# Patient Record
Sex: Female | Born: 1980 | Race: Black or African American | Hispanic: No | Marital: Single | Smoking: Former smoker
Health system: Southern US, Community
[De-identification: ages and names within clinical notes are randomized; demographics above are authoritative.]

## PROBLEM LIST (undated history)

## (undated) DIAGNOSIS — E66813 Obesity, class 3: Secondary | ICD-10-CM

## (undated) DIAGNOSIS — D509 Iron deficiency anemia, unspecified: Secondary | ICD-10-CM

## (undated) DIAGNOSIS — J45909 Unspecified asthma, uncomplicated: Secondary | ICD-10-CM

## (undated) DIAGNOSIS — R7303 Prediabetes: Secondary | ICD-10-CM

## (undated) DIAGNOSIS — R030 Elevated blood-pressure reading, without diagnosis of hypertension: Secondary | ICD-10-CM

## (undated) DIAGNOSIS — IMO0001 Reserved for inherently not codable concepts without codable children: Secondary | ICD-10-CM

## (undated) DIAGNOSIS — E559 Vitamin D deficiency, unspecified: Secondary | ICD-10-CM

## (undated) DIAGNOSIS — J309 Allergic rhinitis, unspecified: Secondary | ICD-10-CM

## (undated) HISTORY — DX: Reserved for inherently not codable concepts without codable children: IMO0001

## (undated) HISTORY — DX: Elevated blood-pressure reading, without diagnosis of hypertension: R03.0

## (undated) HISTORY — DX: Iron deficiency anemia, unspecified: D50.9

## (undated) HISTORY — DX: Vitamin D deficiency, unspecified: E55.9

## (undated) HISTORY — DX: Unspecified asthma, uncomplicated: J45.909

## (undated) HISTORY — DX: Morbid (severe) obesity due to excess calories: E66.01

## (undated) HISTORY — DX: Allergic rhinitis, unspecified: J30.9

## (undated) HISTORY — DX: Obesity, class 3: E66.813

## (undated) HISTORY — DX: Prediabetes: R73.03

---

## 2012-12-21 ENCOUNTER — Emergency Department: Payer: Self-pay | Admitting: Emergency Medicine

## 2012-12-26 ENCOUNTER — Emergency Department: Payer: Self-pay | Admitting: Emergency Medicine

## 2013-10-12 ENCOUNTER — Emergency Department: Payer: Self-pay | Admitting: Emergency Medicine

## 2015-12-03 ENCOUNTER — Ambulatory Visit (INDEPENDENT_AMBULATORY_CARE_PROVIDER_SITE_OTHER): Payer: BC Managed Care – PPO | Admitting: Obstetrics and Gynecology

## 2015-12-03 ENCOUNTER — Other Ambulatory Visit: Payer: Self-pay | Admitting: Obstetrics and Gynecology

## 2015-12-03 VITALS — BP 121/76 | HR 100 | Ht 60.0 in | Wt 333.5 lb

## 2015-12-03 DIAGNOSIS — IMO0001 Reserved for inherently not codable concepts without codable children: Secondary | ICD-10-CM | POA: Insufficient documentation

## 2015-12-03 DIAGNOSIS — Z369 Encounter for antenatal screening, unspecified: Secondary | ICD-10-CM

## 2015-12-03 DIAGNOSIS — D509 Iron deficiency anemia, unspecified: Secondary | ICD-10-CM

## 2015-12-03 DIAGNOSIS — E66813 Obesity, class 3: Secondary | ICD-10-CM

## 2015-12-03 DIAGNOSIS — J45909 Unspecified asthma, uncomplicated: Secondary | ICD-10-CM

## 2015-12-03 DIAGNOSIS — R030 Elevated blood-pressure reading, without diagnosis of hypertension: Secondary | ICD-10-CM

## 2015-12-03 DIAGNOSIS — Z331 Pregnant state, incidental: Secondary | ICD-10-CM

## 2015-12-03 DIAGNOSIS — Z3687 Encounter for antenatal screening for uncertain dates: Secondary | ICD-10-CM

## 2015-12-03 DIAGNOSIS — E559 Vitamin D deficiency, unspecified: Secondary | ICD-10-CM

## 2015-12-03 DIAGNOSIS — Z3490 Encounter for supervision of normal pregnancy, unspecified, unspecified trimester: Secondary | ICD-10-CM

## 2015-12-03 DIAGNOSIS — Z1389 Encounter for screening for other disorder: Secondary | ICD-10-CM

## 2015-12-03 DIAGNOSIS — R7303 Prediabetes: Secondary | ICD-10-CM

## 2015-12-03 DIAGNOSIS — Z113 Encounter for screening for infections with a predominantly sexual mode of transmission: Secondary | ICD-10-CM

## 2015-12-03 DIAGNOSIS — Z36 Encounter for antenatal screening of mother: Secondary | ICD-10-CM

## 2015-12-03 MED ORDER — PROVIDA DHA 16-16-1.25-110 MG PO CAPS: 1.0000 | ORAL_CAPSULE | Freq: Every day | ORAL | Status: AC

## 2015-12-03 NOTE — Patient Instructions (Signed)

## 2015-12-03 NOTE — Progress Notes (Signed)
Savannah Ramos presents for NOB nurse interview visit. G-1.  P-0. Pregnancy confirmed at [facility] on 11/29/15. Pregnancy education material explained and given. No cats in the home. NOB labs ordered. TSH/HbgA1c due to Increased BMI, HIV labs and Drug screen were explained optional and she could opt out of tests but did not decline. Drug screen ordered. PNV encouraged. NT ordered, pt would like this done if her gestational age of baby is within window. Pt. To follow up with provider in 1 week for NOB physical, depending on [place] for viability and dating findings.  All questions answered.  ZIKA EXPOSURE SCREEN:  The patient has not traveled to a [place] Virus endemic area within the past 6 months, nor has she had unprotected sex with a partner who has travelled to a [place] endemic region within the past 6 months. The patient has been advised to notify us if these factors change any time during this current pregnancy, so adequate testing and monitoring can be initiated.

## 2015-12-05 LAB — PAIN MGT SCRN (14 DRUGS), UR
Amphetamine Screen, Ur: NEGATIVE ng/mL
BARBITURATE SCRN UR: NEGATIVE ng/mL
BUPRENORPHINE, URINE: NEGATIVE ng/mL
Benzodiazepine Screen, Urine: NEGATIVE ng/mL
COCAINE(METAB.) SCREEN, URINE: NEGATIVE ng/mL
CREATININE(CRT), U: 150 mg/dL (ref 20.0–300.0)
Cannabinoids Ur Ql Scn: NEGATIVE ng/mL
Fentanyl, Urine: NEGATIVE pg/mL
METHADONE SCREEN, URINE: NEGATIVE ng/mL
Meperidine Screen, Urine: NEGATIVE ng/mL
Opiate Scrn, Ur: NEGATIVE ng/mL
Oxycodone+Oxymorphone Ur Ql Scn: NEGATIVE ng/mL
PCP Scrn, Ur: NEGATIVE ng/mL
PH UR, DRUG SCRN: 5.7 (ref 4.5–8.9)
PROPOXYPHENE SCREEN: NEGATIVE ng/mL
Tramadol Ur Ql Scn: NEGATIVE ng/mL

## 2015-12-05 LAB — URINALYSIS, ROUTINE W REFLEX MICROSCOPIC
Bilirubin, UA: NEGATIVE
Glucose, UA: NEGATIVE
Ketones, UA: NEGATIVE
LEUKOCYTES UA: NEGATIVE
Nitrite, UA: NEGATIVE
PH UA: 6 (ref 5.0–7.5)
RBC, UA: NEGATIVE
Specific Gravity, UA: 1.02 (ref 1.005–1.030)
Urobilinogen, Ur: 0.2 mg/dL (ref 0.2–1.0)

## 2015-12-05 LAB — GC/CHLAMYDIA PROBE AMP
Chlamydia trachomatis, NAA: NEGATIVE
NEISSERIA GONORRHOEAE BY PCR: NEGATIVE

## 2015-12-05 LAB — NICOTINE SCREEN, URINE: COTININE UR QL SCN: NEGATIVE ng/mL

## 2015-12-05 LAB — CULTURE, OB URINE

## 2015-12-05 LAB — URINE CULTURE, OB REFLEX: ORGANISM ID, BACTERIA: NO GROWTH

## 2015-12-06 LAB — CBC WITH DIFFERENTIAL/PLATELET
Basophils Absolute: 0 10*3/uL (ref 0.0–0.2)
Basos: 0 %
EOS (ABSOLUTE): 0.9 10*3/uL — ABNORMAL HIGH (ref 0.0–0.4)
EOS: 6 %
HEMATOCRIT: 31 % — AB (ref 34.0–46.6)
HEMOGLOBIN: 9.5 g/dL — AB (ref 11.1–15.9)
IMMATURE GRANS (ABS): 0.1 10*3/uL (ref 0.0–0.1)
Immature Granulocytes: 1 %
LYMPHS ABS: 1.4 10*3/uL (ref 0.7–3.1)
Lymphs: 10 %
MCH: 20.7 pg — ABNORMAL LOW (ref 26.6–33.0)
MCHC: 30.6 g/dL — AB (ref 31.5–35.7)
MCV: 67 fL — ABNORMAL LOW (ref 79–97)
MONOCYTES: 4 %
Monocytes Absolute: 0.5 10*3/uL (ref 0.1–0.9)
NEUTROS ABS: 10.5 10*3/uL — AB (ref 1.4–7.0)
Neutrophils: 79 %
Platelets: 429 10*3/uL — ABNORMAL HIGH (ref 150–379)
RBC: 4.6 x10E6/uL (ref 3.77–5.28)
RDW: 17.2 % — ABNORMAL HIGH (ref 12.3–15.4)
WBC: 13.3 10*3/uL — AB (ref 3.4–10.8)

## 2015-12-06 LAB — TSH: TSH: 1.02 u[IU]/mL (ref 0.450–4.500)

## 2015-12-06 LAB — HEMOGLOBIN A1C
ESTIMATED AVERAGE GLUCOSE: 120 mg/dL
Hgb A1c MFr Bld: 5.8 % — ABNORMAL HIGH (ref 4.8–5.6)

## 2015-12-06 LAB — RH TYPE: RH TYPE: POSITIVE

## 2015-12-06 LAB — HIV ANTIBODY (ROUTINE TESTING W REFLEX): HIV Screen 4th Generation wRfx: NONREACTIVE

## 2015-12-06 LAB — SICKLE CELL SCREEN: SICKLE CELL SCREEN: NEGATIVE

## 2015-12-06 LAB — RUBELLA ANTIBODY, IGM

## 2015-12-06 LAB — ABO

## 2015-12-06 LAB — VARICELLA ZOSTER ANTIBODY, IGM: Varicella IgM: <0.91 index (ref 0.00–0.90)

## 2015-12-06 LAB — HEPATITIS B SURFACE ANTIGEN: Hepatitis B Surface Ag: NEGATIVE

## 2015-12-06 LAB — ANTIBODY SCREEN: Antibody Screen: NEGATIVE

## 2015-12-06 LAB — RPR: RPR: NONREACTIVE

## 2015-12-07 ENCOUNTER — Other Ambulatory Visit: Payer: Self-pay | Admitting: Obstetrics and Gynecology

## 2015-12-07 DIAGNOSIS — Z2839 Other underimmunization status: Secondary | ICD-10-CM | POA: Insufficient documentation

## 2015-12-07 DIAGNOSIS — Z283 Underimmunization status: Secondary | ICD-10-CM | POA: Insufficient documentation

## 2015-12-07 DIAGNOSIS — O9989 Other specified diseases and conditions complicating pregnancy, childbirth and the puerperium: Principal | ICD-10-CM

## 2015-12-07 DIAGNOSIS — O09899 Supervision of other high risk pregnancies, unspecified trimester: Secondary | ICD-10-CM | POA: Insufficient documentation

## 2015-12-07 MED ORDER — FUSION PLUS PO CAPS: 1.0000 | ORAL_CAPSULE | Freq: Every day | ORAL | Status: AC

## 2015-12-09 ENCOUNTER — Ambulatory Visit (INDEPENDENT_AMBULATORY_CARE_PROVIDER_SITE_OTHER): Payer: BC Managed Care – PPO

## 2015-12-09 ENCOUNTER — Other Ambulatory Visit: Payer: Self-pay | Admitting: Obstetrics and Gynecology

## 2015-12-09 DIAGNOSIS — Z369 Encounter for antenatal screening, unspecified: Secondary | ICD-10-CM

## 2015-12-09 DIAGNOSIS — Z36 Encounter for antenatal screening of mother: Secondary | ICD-10-CM | POA: Diagnosis not present

## 2015-12-15 ENCOUNTER — Ambulatory Visit (INDEPENDENT_AMBULATORY_CARE_PROVIDER_SITE_OTHER): Payer: BC Managed Care – PPO | Admitting: Obstetrics and Gynecology

## 2015-12-15 ENCOUNTER — Encounter: Payer: BC Managed Care – PPO | Admitting: Obstetrics and Gynecology

## 2015-12-15 ENCOUNTER — Encounter: Payer: Self-pay | Admitting: Obstetrics and Gynecology

## 2015-12-15 VITALS — BP 121/73 | HR 96 | Wt 330.2 lb

## 2015-12-15 DIAGNOSIS — Z331 Pregnant state, incidental: Secondary | ICD-10-CM

## 2015-12-15 LAB — POCT URINALYSIS DIPSTICK
BILIRUBIN UA: NEGATIVE
GLUCOSE UA: NEGATIVE
KETONES UA: NEGATIVE
Leukocytes, UA: NEGATIVE
Nitrite, UA: NEGATIVE
RBC UA: NEGATIVE
SPEC GRAV UA: 1.025
Urobilinogen, UA: 0.2
pH, UA: 6

## 2015-12-15 NOTE — Progress Notes (Signed)
NEW OB HISTORY AND PHYSICAL  SUBJECTIVE:       Savannah Ramos is a 35 y.o. G1P0 female, Patient's last menstrual period was 08/06/2015., Estimated Date of Delivery: 05/12/16, [redacted]w[redacted]d, presents today for establishment of Prenatal Care. She has no unusual complaints and complains of nothing      Gynecologic History Patient's last menstrual period was 08/06/2015. Unknown Contraception: none Last Pap: Aug 2016. Results were: normal  Obstetric History OB History  Gravida Para Term Preterm AB SAB TAB Ectopic Multiple Living  1             # Outcome Date GA Lbr Len/2nd Weight Sex Delivery Anes PTL Lv  1 Current               Past Medical History  Diagnosis Date  . Asthma     spring/summer  . Pre-diabetes   . Elevated blood pressure     no diagnosis of HTN  . Vitamin D deficiency   . Allergic rhinitis   . Obesity, Class III, BMI 40-49.9 (morbid obesity) (HCC)     BMI 66 (12/03/2015)  . Anemia, iron deficiency     History reviewed. No pertinent past surgical history.  Current Outpatient Prescriptions on File Prior to Visit  Medication Sig Dispense Refill  . albuterol (PROVENTIL) (2.5 MG/3ML) 0.083% nebulizer solution Take 2.5 mg by nebulization every 6 (six) hours as needed for wheezing or shortness of breath.    . cholecalciferol (VITAMIN D) 400 units TABS tablet Take 400 Units by mouth daily.    Marland Kitchen docusate sodium (COLACE) 100 MG capsule Take 100 mg by mouth 2 (two) times daily.    . Iron-FA-B Cmp-C-Biot-Probiotic (FUSION PLUS) CAPS Take 1 capsule by mouth daily. 60 capsule 1  . Prenat-FeFum-FePo-FA-DHA w/o A (PROVIDA DHA) 16-16-1.25-110 MG CAPS Take 1 tablet by mouth daily. 30 capsule 11   No current facility-administered medications on file prior to visit.    No Known Allergies  Social History   Social History  . Marital Status: Single    Spouse Name: N/A  . Number of Children: N/A  . Years of Education: N/A   Occupational History  .  [employer]    Social History Main Topics  . Smoking status: Former Games developer  . Smokeless tobacco: Never Used  . Alcohol Use: No  . Drug Use: No  . Sexual Activity:    Partners: Male   Other Topics Concern  . Not on file   Social History Narrative    Family History  Problem Relation Age of Onset  . Diabetes Mother   . Diabetes Father   . Heart failure Maternal Grandmother   . Hyperlipidemia Father   . Hypertension Father   . Hypertension Mother     The following portions of the patient's history were reviewed and updated as appropriate: allergies, current medications, past OB history, past medical history, past surgical history, past family history, past social history, and problem list.    OBJECTIVE: Initial Physical Exam (New OB)  GENERAL APPEARANCE: alert, well appearing, in no apparent distress, oriented to person, place and time HEAD: normocephalic, atraumatic MOUTH: mucous membranes moist, pharynx normal without lesions and dental hygiene poor THYROID: no thyromegaly or masses present BREASTS: not examined LUNGS: clear to auscultation, no wheezes, rales or rhonchi, symmetric air entry HEART: regular rate and rhythm, no murmurs ABDOMEN: soft, nontender, nondistended, no abnormal masses, no epigastric pain, obese, fundus not palpable and FHT present EXTREMITIES: no redness or tenderness  in the calves or thighs SKIN: normal coloration and turgor, no rashes LYMPH NODES: no adenopathy palpable NEUROLOGIC: alert, oriented, normal speech, no focal findings or movement disorder noted  PELVIC EXAM not examined  ASSESSMENT: Normal pregnancy Morbid obesity Late entry to care  PLAN: Prenatal care Early glucola Consider transfering based on BMI See orders

## 2015-12-15 NOTE — Progress Notes (Signed)
NOB-pt is doing well, has some concerns about pelvic pressure, a pulling sensation, otherwise she is doing ok

## 2015-12-16 ENCOUNTER — Telehealth: Payer: Self-pay | Admitting: *Deleted

## 2015-12-16 NOTE — Telephone Encounter (Signed)
Notified pt she voiced understanding 

## 2015-12-16 NOTE — Telephone Encounter (Signed)
-----   Message from Purcell Nails, [place] sent at 12/07/2015  5:38 PM EST ----- Please let her know she is still very anemic, I want her to stop current OTC iron and start Fusion Plus ( I sent in a prescription) Please give samples if she needs them

## 2015-12-31 ENCOUNTER — Other Ambulatory Visit: Payer: Self-pay | Admitting: Obstetrics and Gynecology

## 2015-12-31 ENCOUNTER — Ambulatory Visit (INDEPENDENT_AMBULATORY_CARE_PROVIDER_SITE_OTHER): Payer: BC Managed Care – PPO | Admitting: Obstetrics and Gynecology

## 2015-12-31 ENCOUNTER — Other Ambulatory Visit: Payer: BC Managed Care – PPO

## 2015-12-31 ENCOUNTER — Ambulatory Visit (INDEPENDENT_AMBULATORY_CARE_PROVIDER_SITE_OTHER): Payer: BC Managed Care – PPO

## 2015-12-31 ENCOUNTER — Encounter: Payer: Self-pay | Admitting: Obstetrics and Gynecology

## 2015-12-31 VITALS — BP 112/72 | HR 93 | Wt 331.8 lb

## 2015-12-31 DIAGNOSIS — Z36 Encounter for antenatal screening of mother: Secondary | ICD-10-CM | POA: Diagnosis not present

## 2015-12-31 DIAGNOSIS — Z331 Pregnant state, incidental: Secondary | ICD-10-CM

## 2015-12-31 LAB — POCT URINALYSIS DIPSTICK
Bilirubin, UA: NEGATIVE
Glucose, UA: NEGATIVE
Ketones, UA: NEGATIVE
NITRITE UA: NEGATIVE
PROTEIN UA: NEGATIVE
RBC UA: NEGATIVE
SPEC GRAV UA: 1.01
UROBILINOGEN UA: 0.2
pH, UA: 6

## 2015-12-31 NOTE — Progress Notes (Signed)
Indications: Anatomy Scan Findings:  Singleton intrauterine pregnancy is visualized with FHR at 165 BPM. Biometrics give an (U/S) Gestational age of [redacted] weeks 3 days, and an (U/S) EDD of 05/09/16; this correlates with the clinically established EDD of 05/12/16.  Fetal presentation is vertex, spine anterior.  EFW: 416 grams ( 0 lbs. 15 oz.). Placenta: Anterior, grade 0, remote to cervix by 4.7 cm. AFI: appears adequate  Anatomic survey is incomplete due to maternal body habitus and fetal position: Need: posterior fossa, cerebellum, cist magna, orbits, nose/lips, 3 vc, abdominal cord insert, diaphragm, kidneys, spine in transverse only, all heart views, and lower extremities.  Gender - Female.   Right and left ovaries were not visualized. There is no evidence of a corpus luteal cyst seen on today's exam. Survey of the adnexa demonstrates no adnexal masses. There is no free peritoneal fluid in the cul de sac.  Impression: 1. 21 week 3 day Viable Singleton Intrauterine pregnancy by U/S. 2. (U/S) EDD is consistent with Clinically established (LMP) EDD of 05/12/16. 3. Incomplete anatomy scan due to maternal body habitus and fetal positioning. See above for anatomy not seen.  Discussed need for transfer to high risks facility based on weight and BMI- will call back and let us know where she wants to go. To do follow-up scan there.

## 2015-12-31 NOTE — Progress Notes (Signed)
ROB- anatomy scan done today, pt denies any new complaints

## 2016-01-01 LAB — GLUCOSE, 1 HOUR GESTATIONAL: GESTATIONAL DIABETES SCREEN: 84 mg/dL (ref 65–139)

## 2016-01-04 ENCOUNTER — Telehealth: Payer: Self-pay | Admitting: *Deleted

## 2016-01-04 NOTE — Telephone Encounter (Signed)
Notified pt of lab results 

## 2016-01-04 NOTE — Telephone Encounter (Signed)
-----   Message from Purcell Nails, [place] sent at 01/03/2016  9:31 AM EST ----- Please let her know she passed her glucola

## 2016-01-06 ENCOUNTER — Other Ambulatory Visit: Payer: BC Managed Care – PPO

## 2016-10-07 ENCOUNTER — Encounter (HOSPITAL_COMMUNITY): Payer: Self-pay

## 2017-10-04 ENCOUNTER — Emergency Department: Payer: BC Managed Care – PPO

## 2017-10-04 ENCOUNTER — Emergency Department
Admission: EM | Admit: 2017-10-04 | Discharge: 2017-10-04 | Disposition: A | Discharge status: Home / Self Care | Payer: BC Managed Care – PPO | Attending: Student in an Organized Health Care Education/Training Program | Admitting: Student in an Organized Health Care Education/Training Program

## 2017-10-04 DIAGNOSIS — Z87891 Personal history of nicotine dependence: Secondary | ICD-10-CM | POA: Diagnosis not present

## 2017-10-04 DIAGNOSIS — J40 Bronchitis, not specified as acute or chronic: Secondary | ICD-10-CM | POA: Insufficient documentation

## 2017-10-04 DIAGNOSIS — Z79899 Other long term (current) drug therapy: Secondary | ICD-10-CM | POA: Insufficient documentation

## 2017-10-04 DIAGNOSIS — R079 Chest pain, unspecified: Secondary | ICD-10-CM | POA: Diagnosis present

## 2017-10-04 LAB — CBC
HEMATOCRIT: 35.7 % (ref 35.0–47.0)
HEMOGLOBIN: 11 g/dL — AB (ref 12.0–16.0)
MCH: 22.3 pg — AB (ref 26.0–34.0)
MCHC: 30.8 g/dL — AB (ref 32.0–36.0)
MCV: 72.5 fL — AB (ref 80.0–100.0)
Platelets: 410 10*3/uL (ref 150–440)
RBC: 4.93 MIL/uL (ref 3.80–5.20)
RDW: 16.3 % — AB (ref 11.5–14.5)
WBC: 12.4 10*3/uL — ABNORMAL HIGH (ref 3.6–11.0)

## 2017-10-04 LAB — BASIC METABOLIC PANEL
Anion gap: 12 (ref 5–15)
BUN: 10 mg/dL (ref 6–20)
CHLORIDE: 102 mmol/L (ref 101–111)
CO2: 26 mmol/L (ref 22–32)
Calcium: 8.6 mg/dL — ABNORMAL LOW (ref 8.9–10.3)
Creatinine, Ser: 0.71 mg/dL (ref 0.44–1.00)
GFR calc Af Amer: >60 mL/min (ref >60)
GLUCOSE: 103 mg/dL — AB (ref 65–99)
POTASSIUM: 4.1 mmol/L (ref 3.5–5.1)
Sodium: 140 mmol/L (ref 135–145)

## 2017-10-04 LAB — TROPONIN I: Troponin I: <0.03 ng/mL (ref <0.03)

## 2017-10-04 MED ORDER — ALBUTEROL SULFATE HFA 108 (90 BASE) MCG/ACT IN AERS: 2.0000 | INHALATION_SPRAY | Freq: Four times a day (QID) | RESPIRATORY_TRACT | 2 refills | Status: AC | PRN

## 2017-10-04 MED ORDER — PREDNISONE 20 MG PO TABS: 40.0000 mg | ORAL_TABLET | Freq: Every day | ORAL | 0 refills | Status: AC

## 2017-10-04 NOTE — ED Provider Notes (Addendum)
[facility] Emergency Department Provider Note    First MD Initiated Contact with Patient 10/04/17 2222     (approximate)  I have reviewed the triage vital signs and the nursing notes.   HISTORY  Chief Complaint Chest Pain    HPI Savannah Ramos is a 36 y.o. female with a history of asthma presents with chief complaint of midsternal chest pain radiating through to her back associated with coughing.  Describes it as a nagging ache and mild in severity.  Denies any nausea or diaphoresis.  No productive cough.  States that she has been more stressed at work.  Has not been using her inhaler at home.  Has not been on any steroids recently.  No personal history of heart attack.  Denies any orthopnea or lower extremity swelling.  Past Medical History:  Diagnosis Date  . Allergic rhinitis   . Anemia, iron deficiency   . Asthma    spring/summer  . Elevated blood pressure    no diagnosis of HTN  . Obesity, Class III, BMI 40-49.9 (morbid obesity) (HCC)    BMI 66 (12/03/2015)  . Pre-diabetes   . Vitamin D deficiency    Family History  Problem Relation Age of Onset  . Diabetes Mother   . Hypertension Mother   . Diabetes Father   . Hyperlipidemia Father   . Hypertension Father   . Heart failure Maternal Grandmother    Past Surgical History:  Procedure Laterality Date  . CESAREAN SECTION     Patient Active Problem List   Diagnosis Date Noted  . Rubella non-immune status, antepartum 12/07/2015  . Maternal varicella, non-immune 12/07/2015  . Asthma   . Pre-diabetes   . Elevated blood pressure   . Vitamin D deficiency   . Anemia, iron deficiency   . Obesity, Class III, BMI 40-49.9 (morbid obesity) (HCC)       Prior to Admission medications   Medication Sig Start Date End Date Taking? Authorizing Provider  albuterol (PROVENTIL) (2.5 MG/3ML) 0.083% nebulizer solution Take 2.5 mg by nebulization every 6 (six) hours as needed for wheezing or shortness of  breath.    [provider]  cholecalciferol (VITAMIN D) 400 units TABS tablet Take 400 Units by mouth daily.    [provider]  docusate sodium (COLACE) 100 MG capsule Take 100 mg by mouth 2 (two) times daily.    [provider]  Iron-FA-B Cmp-C-Biot-Probiotic (FUSION PLUS) CAPS Take 1 capsule by mouth daily. 12/07/15   Shambley, Melody N, CNM  Prenat-FeFum-FePo-FA-DHA w/o A (PROVIDA DHA) 16-16-1.25-110 MG CAPS Take 1 tablet by mouth daily. 12/03/15   Purcell NailsShambley, Melody N, CNM    Allergies Patient has no known allergies.    Social History Social History   Tobacco Use  . Smoking status: Former Games developermoker  . Smokeless tobacco: Never Used  Substance Use Topics  . Alcohol use: No    Alcohol/week: 0.0 oz  . Drug use: No    Review of Systems Patient denies headaches, rhinorrhea, blurry vision, numbness, shortness of breath, chest pain, edema, cough, abdominal pain, nausea, vomiting, diarrhea, dysuria, fevers, rashes or hallucinations unless otherwise stated above in HPI. ____________________________________________   PHYSICAL EXAM:  VITAL SIGNS: Vitals:   10/04/17 1919 10/04/17 2200  BP: 119/71 (!) 155/63  Pulse: 87 90  Resp: 17 (!) 51  Temp: 97.8 F (36.6 C)   SpO2: 99% 99%    Constitutional: Alert and oriented. Well appearing and in no acute distress. Eyes:  Conjunctivae are normal.  Head: Atraumatic. Nose: No congestion/rhinnorhea. Mouth/Throat: Mucous membranes are moist.   Neck: No stridor. Painless ROM.  Cardiovascular: Normal rate, regular rhythm. Grossly normal heart sounds.  Good peripheral circulation. Respiratory: Normal respiratory effort.  No retractions. Lungs CTAB. Gastrointestinal: Soft and nontender. No distention. No abdominal bruits. No CVA tenderness. Genitourinary:  Musculoskeletal: No lower extremity tenderness nor edema.  No joint effusions. Neurologic:  Normal speech and language. No gross focal neurologic deficits are  appreciated. No facial droop Skin:  Skin is warm, dry and intact. No rash noted. Psychiatric: Mood and affect are normal. Speech and behavior are normal.  ____________________________________________   LABS (all labs ordered are listed, but only abnormal results are displayed)  Results for orders placed or performed during the hospital encounter of 10/04/17 (from the past 24 hour(s))  Basic metabolic panel     Status: Abnormal   Collection Time: 10/04/17  7:16 PM  Result Value Ref Range   Sodium 140 135 - 145 mmol/L   Potassium 4.1 3.5 - 5.1 mmol/L   Chloride 102 101 - 111 mmol/L   CO2 26 22 - 32 mmol/L   Glucose, Bld 103 (H) 65 - 99 mg/dL   BUN 10 6 - 20 mg/dL   Creatinine, Ser 1.61 0.44 - 1.00 mg/dL   Calcium 8.6 (L) 8.9 - 10.3 mg/dL   GFR calc non Af Amer >60 >60 mL/min   GFR calc Af Amer >60 >60 mL/min   Anion gap 12 5 - 15  CBC     Status: Abnormal   Collection Time: 10/04/17  7:16 PM  Result Value Ref Range   WBC 12.4 (H) 3.6 - 11.0 K/uL   RBC 4.93 3.80 - 5.20 MIL/uL   Hemoglobin 11.0 (L) 12.0 - 16.0 g/dL   HCT 09.6 04.5 - 40.9 %   MCV 72.5 (L) 80.0 - 100.0 fL   MCH 22.3 (L) 26.0 - 34.0 pg   MCHC 30.8 (L) 32.0 - 36.0 g/dL   RDW 81.1 (H) 91.4 - 78.2 %   Platelets 410 150 - 440 K/uL  Troponin I     Status: None   Collection Time: 10/04/17  7:16 PM  Result Value Ref Range   Troponin I <0.03 <0.03 ng/mL   ____________________________________________  EKG My review and personal interpretation at Time: 19:12   Indication: chest pain  Rate: 90  Rhythm: sinus Axis: normal Other: normal intervals, no stemi ____________________________________________  RADIOLOGY  I personally reviewed all radiographic images ordered to evaluate for the above acute complaints and reviewed radiology reports and findings.  These findings were personally discussed with the patient.  Please see medical record for radiology  report.  ____________________________________________   PROCEDURES  Procedure(s) performed:  Procedures    Critical Care performed: no ____________________________________________   INITIAL IMPRESSION / ASSESSMENT AND PLAN / ED COURSE  Pertinent labs & imaging results that were available during my care of the patient were reviewed by me and considered in my medical decision making (see chart for details).  DDX: ACS, pericarditis, esophagitis, boerhaaves, pe, dissection, pna, bronchitis, costochondritis   Savannah Ramos is a 36 y.o. who presents to the ED with symptoms as described above.  EKG shows no evidence of EKG changes troponin is negative.  Patient with a heart score of 2.  This not clinically consistent with dissection or pulmonary embolism.  Blood work is otherwise reassuring.  Does have mild leukocytosis but symptoms are also slightly consistent with bronchitis which could  create this reaction.  Will give patient prescription for albuterol and prednisone burst with follow-up with PCP.      ____________________________________________   FINAL CLINICAL IMPRESSION(S) / ED DIAGNOSES  Final diagnoses:  Bronchitis      NEW MEDICATIONS STARTED DURING THIS VISIT:  This SmartLink is deprecated. Use AVSMEDLIST instead to display the medication list for a patient.   Note:  This document was prepared using Dragon voice recognition software and may include unintentional dictation errors.    Willy Eddyobinson, Dupree Givler, MD 10/04/17 [id number]    Willy Eddyobinson, Jabin Tapp, MD 10/04/17 [phone]

## 2017-10-04 NOTE — ED Triage Notes (Signed)
Patient c/o left chest pain radiating to back with accompanying SOB that began yesterday. Patient reports hx of the same.

## 2019-06-03 IMAGING — CR DG CHEST 2V
3 series · 3 of 3 positions shown · non-contrast
Comparison: 10/12/2013

CLINICAL DATA: Left chest pain

EXAM:
CHEST  2 VIEW

[chest pa]
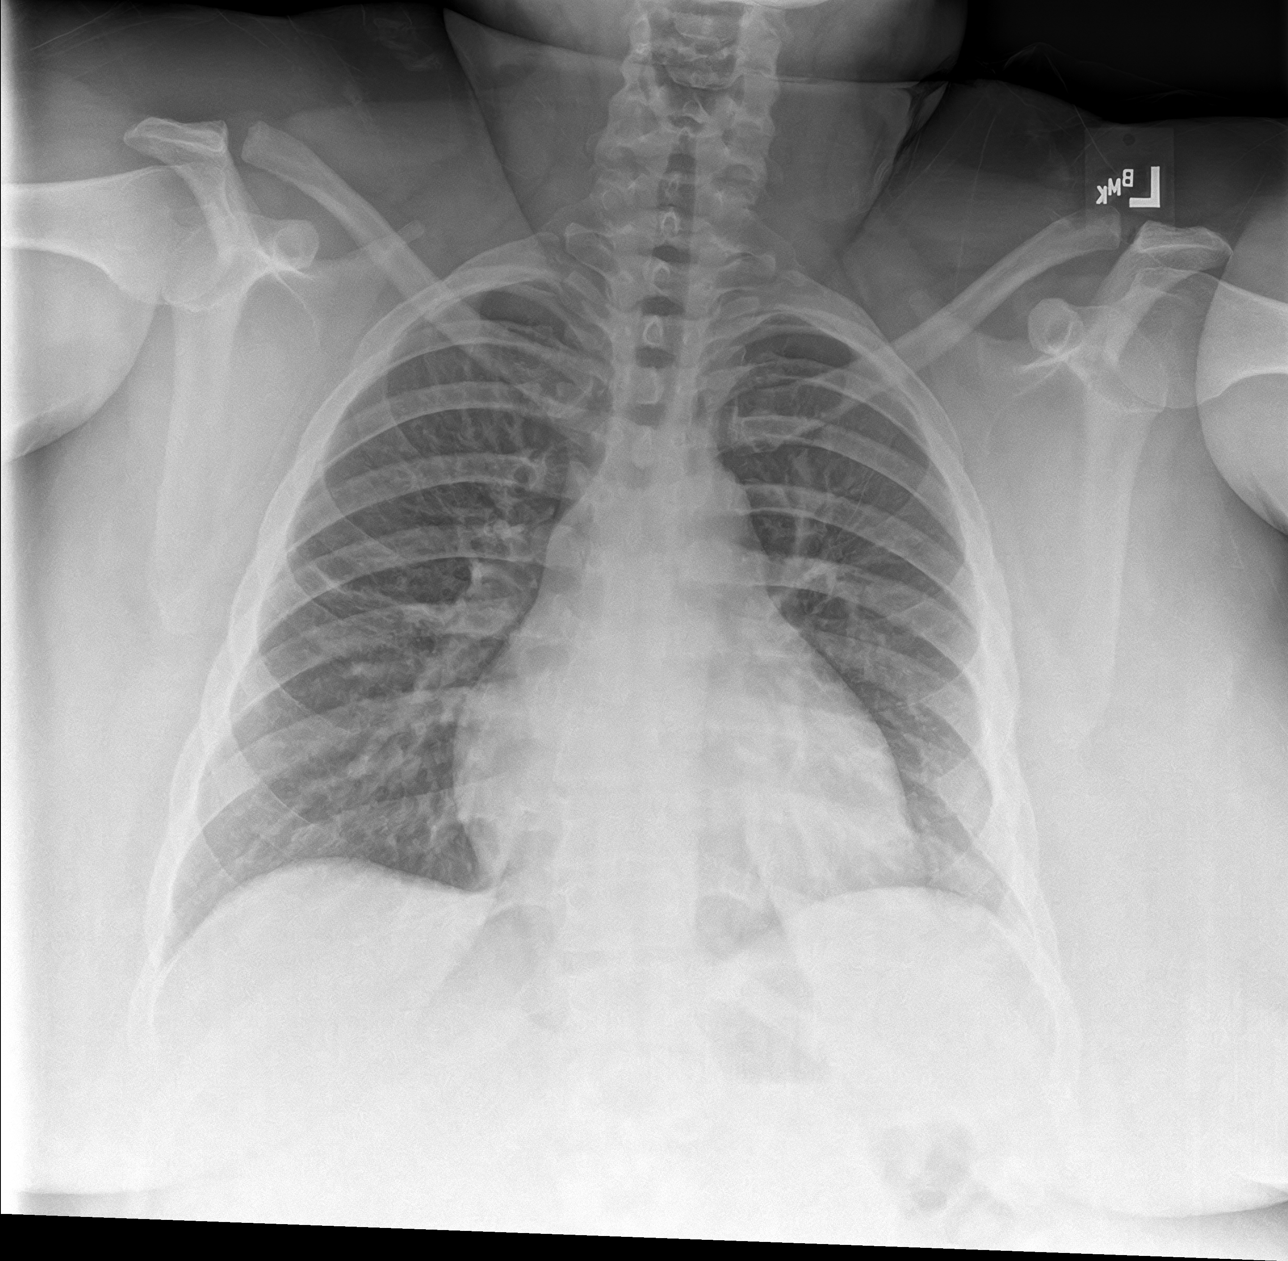

[chest lat (1 of 2)]
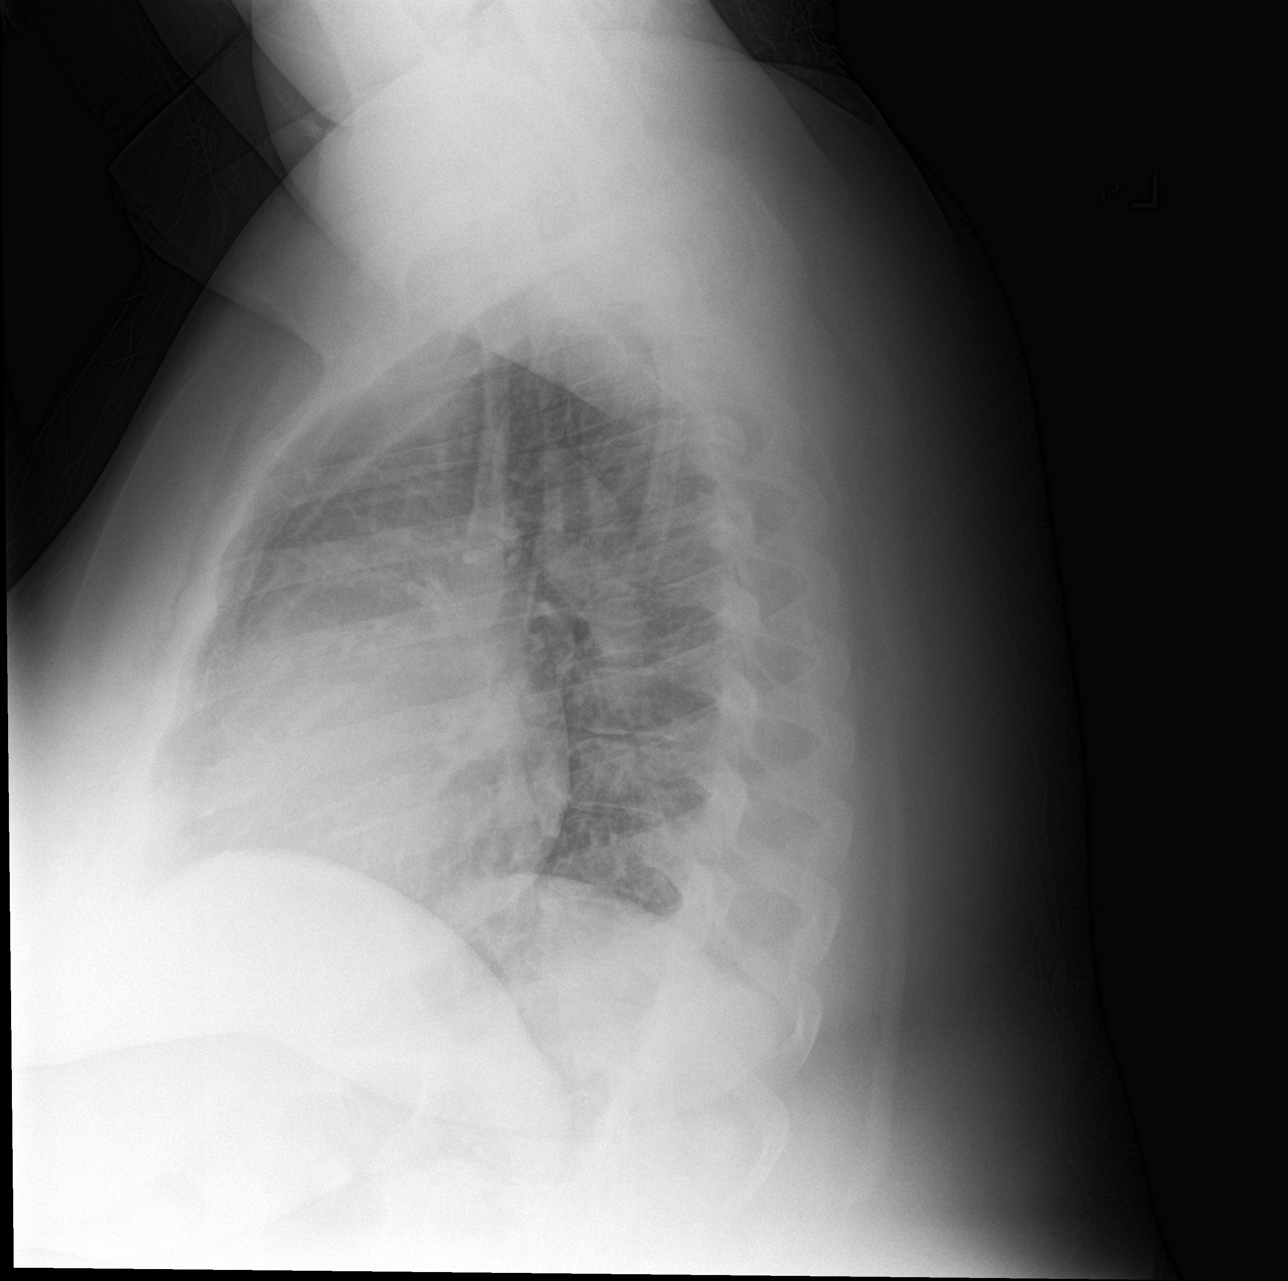

[chest lat (2 of 2)]
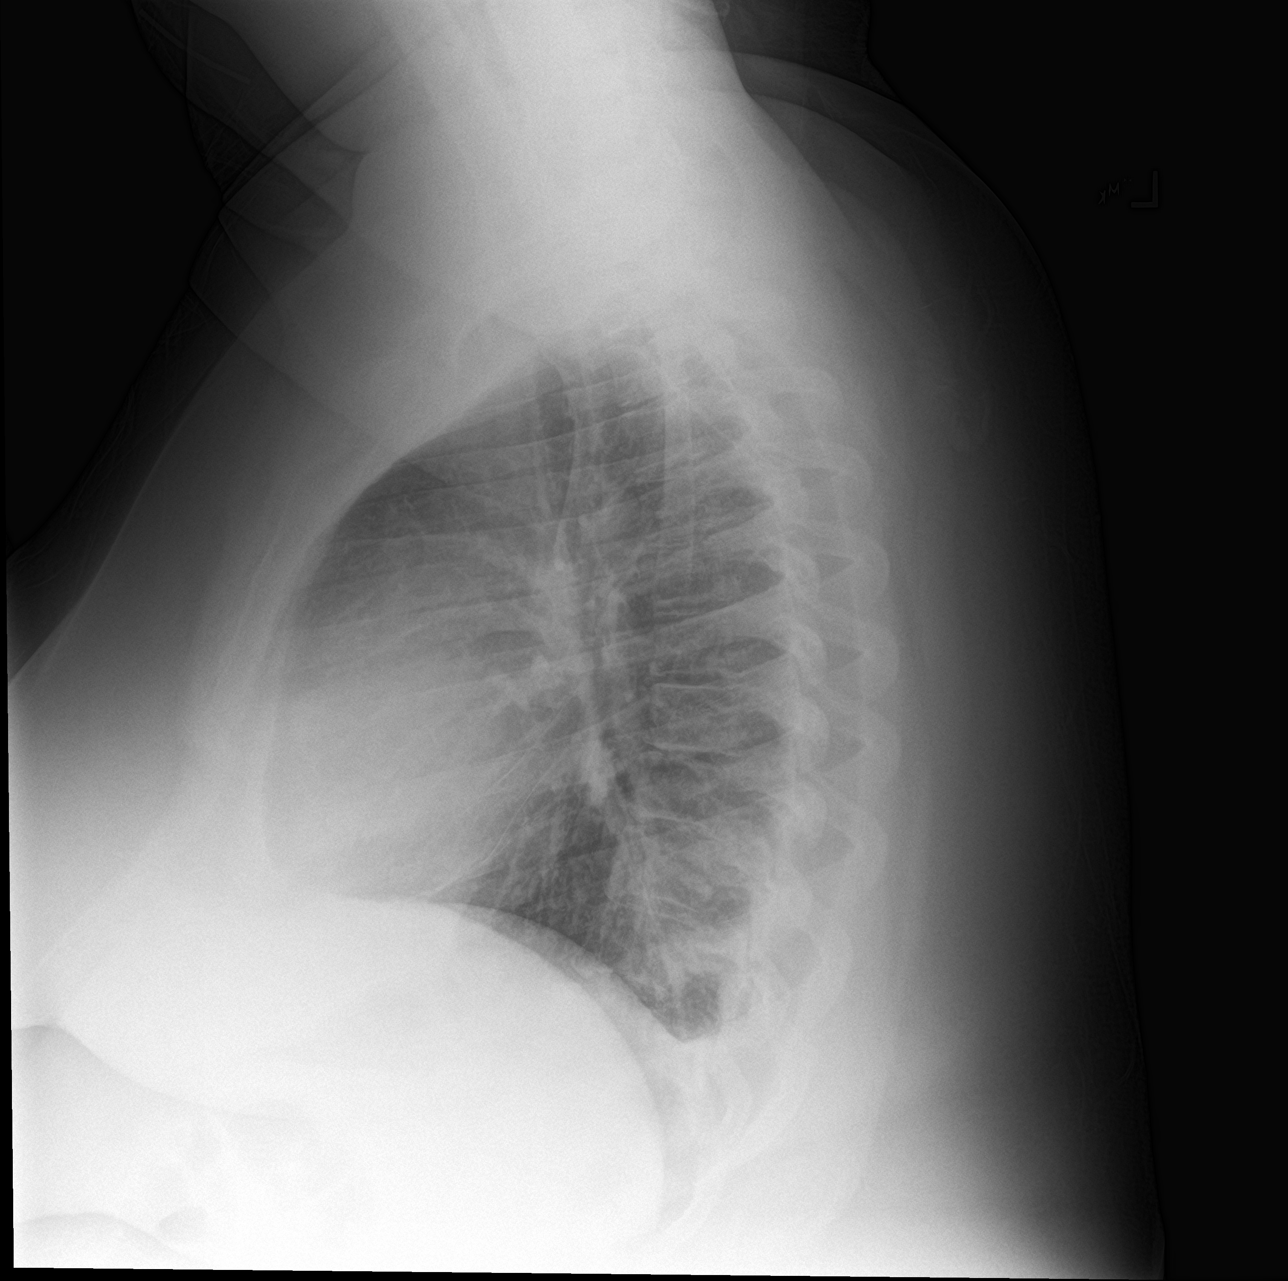

[3 of 3 positions shown; findings below may reference images not displayed]

FINDINGS: Chronic mild cardiomegaly. No edema, effusion, or air bronchogram.
Negative for pneumothorax. Chronic mild scarring in the right mid
lung.
IMPRESSION: 1. No evidence of acute disease.
2. Chronic mild cardiomegaly.

## 2020-02-01 ENCOUNTER — Ambulatory Visit: Payer: BC Managed Care – PPO | Attending: Internal Medicine

## 2020-02-01 DIAGNOSIS — Z23 Encounter for immunization: Secondary | ICD-10-CM

## 2020-02-01 NOTE — Progress Notes (Signed)
   Covid-19 Vaccination Clinic  Name:  Savannah Ramos    MRN: [id number] DOB: [birth year]  02/01/2020  [name] was observed post Covid-19 immunization for 15 minutes without incident. She was provided with Vaccine Information Sheet and instruction to access the V-Safe system.   [name] was instructed to call 911 with any severe reactions post vaccine: Marland Kitchen Difficulty breathing  . Swelling of face and throat  . A fast heartbeat  . A bad rash all over body  . Dizziness and weakness   Immunizations Administered    Name Date Dose VIS Date Route   Pfizer COVID-19 Vaccine 02/01/2020  2:08 PM 0.3 mL 10/17/2019 Intramuscular   Manufacturer: [employer], [employer]   Lot: [id number]   NDC: [address]-8

## 2020-02-25 ENCOUNTER — Ambulatory Visit: Payer: BC Managed Care – PPO | Attending: Internal Medicine

## 2020-02-25 DIAGNOSIS — Z23 Encounter for immunization: Secondary | ICD-10-CM

## 2020-02-25 NOTE — Progress Notes (Signed)
   Covid-19 Vaccination Clinic  Name:  Savannah Ramos    MRN: [id number] DOB: 11-18-1980  02/25/2020  Savannah Ramos was observed post Covid-19 immunization for 15 minutes without incident. She was provided with Vaccine Information Sheet and instruction to access the V-Safe system.   Savannah Ramos was instructed to call 911 with any severe reactions post vaccine: Marland Kitchen Difficulty breathing  . Swelling of face and throat  . A fast heartbeat  . A bad rash all over body  . Dizziness and weakness   Immunizations Administered    Name Date Dose VIS Date Route   Pfizer COVID-19 Vaccine 02/25/2020 12:08 PM 0.3 mL 12/31/2018 Intramuscular   Manufacturer: [employer], [employer]   Lot: [id number]   NDC: [address]-8

## 2023-10-03 ENCOUNTER — Other Ambulatory Visit: Payer: Self-pay | Admitting: Family Medicine

## 2023-10-03 DIAGNOSIS — Z1231 Encounter for screening mammogram for malignant neoplasm of breast: Secondary | ICD-10-CM

## 2023-11-28 ENCOUNTER — Other Ambulatory Visit: Payer: Self-pay | Admitting: Family Medicine

## 2023-11-28 DIAGNOSIS — Z1231 Encounter for screening mammogram for malignant neoplasm of breast: Secondary | ICD-10-CM

## 2023-12-06 ENCOUNTER — Inpatient Hospital Stay: Payer: 59

## 2023-12-06 ENCOUNTER — Telehealth: Payer: Self-pay | Admitting: Oncology

## 2023-12-06 ENCOUNTER — Inpatient Hospital Stay: Payer: 59 | Admitting: Oncology

## 2023-12-06 NOTE — Telephone Encounter (Signed)
Patient left voicemail that she missed her appointment and requested a call back so she could reschedule- I called her back to reschedule left voicemail for her to call back-

## 2023-12-10 ENCOUNTER — Inpatient Hospital Stay: Payer: 59

## 2023-12-10 ENCOUNTER — Encounter: Payer: Self-pay | Admitting: Oncology

## 2023-12-10 ENCOUNTER — Inpatient Hospital Stay: Payer: 59 | Attending: Oncology | Admitting: Oncology

## 2023-12-10 VITALS — BP 141/82 | HR 78 | Temp 99.1°F | Resp 18 | Wt 297.9 lb

## 2023-12-10 DIAGNOSIS — Z8639 Personal history of other endocrine, nutritional and metabolic disease: Secondary | ICD-10-CM

## 2023-12-10 DIAGNOSIS — D72829 Elevated white blood cell count, unspecified: Secondary | ICD-10-CM | POA: Insufficient documentation

## 2023-12-10 DIAGNOSIS — Z87891 Personal history of nicotine dependence: Secondary | ICD-10-CM | POA: Insufficient documentation

## 2023-12-10 DIAGNOSIS — D509 Iron deficiency anemia, unspecified: Secondary | ICD-10-CM | POA: Insufficient documentation

## 2023-12-10 LAB — CBC WITH DIFFERENTIAL/PLATELET
Abs Immature Granulocytes: 0.08 10*3/uL — ABNORMAL HIGH (ref 0.00–0.07)
Basophils Absolute: 0 10*3/uL (ref 0.0–0.1)
Basophils Relative: 0 %
Eosinophils Absolute: 0.2 10*3/uL (ref 0.0–0.5)
Eosinophils Relative: 2 %
HCT: 30.5 % — ABNORMAL LOW (ref 36.0–46.0)
Hemoglobin: 8.5 g/dL — ABNORMAL LOW (ref 12.0–15.0)
Immature Granulocytes: 1 %
Lymphocytes Relative: 18 %
Lymphs Abs: 2 10*3/uL (ref 0.7–4.0)
MCH: 19.8 pg — ABNORMAL LOW (ref 26.0–34.0)
MCHC: 27.9 g/dL — ABNORMAL LOW (ref 30.0–36.0)
MCV: 71.1 fL — ABNORMAL LOW (ref 80.0–100.0)
Monocytes Absolute: 0.6 10*3/uL (ref 0.1–1.0)
Monocytes Relative: 6 %
Neutro Abs: 8.4 10*3/uL — ABNORMAL HIGH (ref 1.7–7.7)
Neutrophils Relative %: 73 %
Platelets: 423 10*3/uL — ABNORMAL HIGH (ref 150–400)
RBC: 4.29 MIL/uL (ref 3.87–5.11)
RDW: 17.1 % — ABNORMAL HIGH (ref 11.5–15.5)
WBC: 11.4 10*3/uL — ABNORMAL HIGH (ref 4.0–10.5)
nRBC: 0 % (ref 0.0–0.2)

## 2023-12-10 LAB — IRON AND TIBC
Iron: 23 ug/dL — ABNORMAL LOW (ref 28–170)
Saturation Ratios: 5 % — ABNORMAL LOW (ref 10.4–31.8)
TIBC: 424 ug/dL (ref 250–450)
UIBC: 401 ug/dL

## 2023-12-10 LAB — COMPREHENSIVE METABOLIC PANEL WITH GFR
ALT: 12 U/L (ref 0–44)
AST: 13 U/L — ABNORMAL LOW (ref 15–41)
Albumin: 3.5 g/dL (ref 3.5–5.0)
Alkaline Phosphatase: 78 U/L (ref 38–126)
Anion gap: 9 (ref 5–15)
BUN: 10 mg/dL (ref 6–20)
CO2: 24 mmol/L (ref 22–32)
Calcium: 8.1 mg/dL — ABNORMAL LOW (ref 8.9–10.3)
Chloride: 103 mmol/L (ref 98–111)
Creatinine, Ser: 0.86 mg/dL (ref 0.44–1.00)
GFR, Estimated: >60 mL/min
Glucose, Bld: 103 mg/dL — ABNORMAL HIGH (ref 70–99)
Potassium: 3.6 mmol/L (ref 3.5–5.1)
Sodium: 136 mmol/L (ref 135–145)
Total Bilirubin: 0.3 mg/dL (ref 0.0–1.2)
Total Protein: 7.5 g/dL (ref 6.5–8.1)

## 2023-12-10 LAB — HEPATITIS PANEL, ACUTE
HCV Ab: NONREACTIVE
Hep A IgM: NONREACTIVE
Hep B C IgM: NONREACTIVE
Hepatitis B Surface Ag: NONREACTIVE

## 2023-12-10 LAB — TECHNOLOGIST SMEAR REVIEW

## 2023-12-10 LAB — HIV ANTIBODY (ROUTINE TESTING W REFLEX): HIV Screen 4th Generation wRfx: NONREACTIVE

## 2023-12-10 LAB — LACTATE DEHYDROGENASE: LDH: 109 U/L (ref 98–192)

## 2023-12-10 LAB — FERRITIN: Ferritin: 10 ng/mL — ABNORMAL LOW (ref 11–307)

## 2023-12-10 NOTE — Assessment & Plan Note (Signed)
Labs reviewed and discussed with patient that Leukocytosis, predominantly neutrophilia, can be secondary to infection, chronic inflammation, smoking, autoimmune disease, or underlying bone marrow disorders.  For the work up of patient's leukocytosis, I recommend checking CBC;CMP, LDH, smear review, peripheral flowcytometry, hepatitis, HIV, monoclonal gammopathy workup.

## 2023-12-10 NOTE — Assessment & Plan Note (Signed)
Lab Results  Component Value Date   HGB 8.5 (L) 12/10/2023   TIBC 424 12/10/2023   IRONPCTSAT 5 (L) 12/10/2023   FERRITIN 10 (L) 12/10/2023    She has just started on iron supplementation. Will repeat levels in a few month. If not effective, consider IV Venofer

## 2023-12-10 NOTE — Progress Notes (Signed)
Hematology/Oncology Consult Note [phone] [phone]     REFERRING PROVIDER: Center, [facility]   CHIEF COMPLAINTS/REASON FOR VISIT:  Evaluation of leukocytosis  ASSESSMENT & PLAN:   Leukocytosis  Labs reviewed and discussed with patient that Leukocytosis, predominantly neutrophilia, can be secondary to infection, chronic inflammation, smoking, autoimmune disease, or underlying bone marrow disorders.  For the work up of patient's leukocytosis, I recommend checking CBC;CMP, LDH, smear review, peripheral flowcytometry, hepatitis, HIV, monoclonal gammopathy workup.    History of iron deficiency Lab Results  Component Value Date   HGB 8.5 (L) 12/10/2023   TIBC 424 12/10/2023   IRONPCTSAT 5 (L) 12/10/2023   FERRITIN 10 (L) 12/10/2023    She has just started on iron supplementation. Will repeat levels in a few month. If not effective, consider IV Venofer    Orders Placed This Encounter  Procedures   CBC with Differential/Platelet    Standing Status:   Future    Number of Occurrences:   1    Expected Date:   12/10/2023    Expiration Date:   12/09/2024   Comprehensive metabolic panel    Standing Status:   Future    Number of Occurrences:   1    Expected Date:   12/10/2023    Expiration Date:   12/09/2024   Flow cytometry panel-leukemia/lymphoma work-up    Standing Status:   Future    Number of Occurrences:   1    Expected Date:   12/10/2023    Expiration Date:   12/09/2024   Lactate dehydrogenase    Standing Status:   Future    Number of Occurrences:   1    Expected Date:   12/10/2023    Expiration Date:   12/09/2024   Hepatitis panel, acute    Standing Status:   Future    Number of Occurrences:   1    Expected Date:   12/10/2023    Expiration Date:   12/09/2024   HIV Antibody (routine testing w rflx)    Standing Status:   Future    Number of Occurrences:   1    Expected Date:   12/10/2023    Expiration Date:   12/09/2024   Protein electrophoresis,  serum    Standing Status:   Future    Number of Occurrences:   1    Expected Date:   12/10/2023    Expiration Date:   12/09/2024   Ferritin    Standing Status:   Future    Number of Occurrences:   1    Expected Date:   12/10/2023    Expiration Date:   06/08/2024   Iron and TIBC    Standing Status:   Future    Number of Occurrences:   1    Expected Date:   12/10/2023    Expiration Date:   12/09/2024   Technologist smear review    Standing Status:   Future    Number of Occurrences:   1    Expected Date:   12/10/2023    Expiration Date:   12/09/2024    Clinical information::   leukocytosis    Return of visit: 2-3 weeks to discuss results.  Cc Shane Crutch, [place] All questions were answered. The patient knows to call the clinic with any problems, questions or concerns.  Rickard Patience, MD, PhD [facility] Hematology Oncology 12/10/2023    HISTORY OF PRESENTING ILLNESS:  Savannah Ramos is a  43 y.o.  female with PMH listed below who was referred to me for evaluation of leukocytosis Reviewed patient' recent labs obtained by PCP.   The patient has chronic elevated white blood cell count, dated back to 2017 Denies weight loss, fever, chills, fatigue, night sweats.   She is currently on Nexplanon for birth control and has recently resumed taking iron tablets due to low iron levels. She experiences constipation with iron supplementation, which she manages with stool softeners and orange juice. She has been taking the iron tablets for about two weeks.  No foreign bodies such as dental implants or knee replacements. No chronic wounds, rashes, night sweats, or fever, although she experiences occasional hot flashes. She mentions having 'little bumps' but does not consider them significant. She reports a good appetite.   MEDICAL HISTORY:  Past Medical History:  Diagnosis Date   Allergic rhinitis    Anemia, iron deficiency    Asthma    spring/summer   Elevated blood pressure    no diagnosis of HTN    Obesity, Class III, BMI 40-49.9 (morbid obesity) (HCC)    BMI 66 (12/03/2015)   Pre-diabetes    Vitamin D deficiency     SURGICAL HISTORY: Past Surgical History:  Procedure Laterality Date   CESAREAN SECTION      SOCIAL HISTORY: Social History   Socioeconomic History   Marital status: Single    Spouse name: Not on file   Number of children: Not on file   Years of education: Not on file   Highest education level: Not on file  Occupational History    Employer: [employer]  Tobacco Use   Smoking status: Former   Smokeless tobacco: Never  Substance and Sexual Activity   Alcohol use: No    Alcohol/week: 0.0 standard drinks of alcohol   Drug use: No   Sexual activity: Yes    Partners: Male  Other Topics Concern   Not on file  Social History Narrative   Not on file   Social Drivers of Health   Financial Resource Strain: Low Risk  (12/10/2023)   Overall Financial Resource Strain (CARDIA)    Difficulty of Paying Living Expenses: Not very hard  Food Insecurity: No Food Insecurity (12/10/2023)   Hunger Vital Sign    Worried About Running Out of Food in the Last Year: Never true    Ran Out of Food in the Last Year: Never true  Transportation Needs: No Transportation Needs (12/10/2023)   PRAPARE - Administrator, Civil Service (Medical): No    Lack of Transportation (Non-Medical): No  Physical Activity: Not on file  Stress: Not on file  Social Connections: Not on file  Intimate Partner Violence: Not At Risk (12/10/2023)   Humiliation, Afraid, Rape, and Kick questionnaire    Fear of Current or Ex-Partner: No    Emotionally Abused: No    Physically Abused: No    Sexually Abused: No    FAMILY HISTORY: Family History  Problem Relation Age of Onset   Diabetes Mother    Hypertension Mother    Diabetes Father    Hyperlipidemia Father    Hypertension Father    Heart failure Maternal Grandmother     ALLERGIES:  has no known  allergies.  MEDICATIONS:  Current Outpatient Medications  Medication Sig Dispense Refill   albuterol (PROVENTIL HFA;VENTOLIN HFA) 108 (90 Base) MCG/ACT inhaler Inhale 2 puffs into the lungs every 6 (six) hours as needed for wheezing or shortness of breath. 1 Inhaler  2   albuterol (PROVENTIL) (2.5 MG/3ML) 0.083% nebulizer solution Take 2.5 mg by nebulization every 6 (six) hours as needed for wheezing or shortness of breath.     Iron-FA-B Cmp-C-Biot-Probiotic (FUSION PLUS) CAPS Take 1 capsule by mouth daily. 60 capsule 1   sertraline (ZOLOFT) 100 MG tablet Take 100 mg by mouth daily.     sitaGLIPtin (JANUVIA) 25 MG tablet Take 25 mg by mouth daily.     docusate sodium (COLACE) 100 MG capsule Take 100 mg by mouth 2 (two) times daily. (Patient not taking: Reported on 12/10/2023)     Prenat-FeFum-FePo-FA-DHA w/o A (PROVIDA DHA) 16-16-1.25-110 MG CAPS Take 1 tablet by mouth daily. (Patient not taking: Reported on 12/10/2023) 30 capsule 11   No current facility-administered medications for this visit.    Review of Systems  Constitutional:  Negative for appetite change, chills, fatigue and fever.  HENT:   Negative for hearing loss and voice change.   Eyes:  Negative for eye problems.  Respiratory:  Negative for chest tightness and cough.   Cardiovascular:  Negative for chest pain.  Gastrointestinal:  Positive for constipation. Negative for abdominal distention, abdominal pain and blood in stool.  Endocrine: Negative for hot flashes.  Genitourinary:  Negative for difficulty urinating and frequency.   Musculoskeletal:  Negative for arthralgias.  Skin:  Negative for itching and rash.  Neurological:  Negative for extremity weakness.  Hematological:  Negative for adenopathy.  Psychiatric/Behavioral:  Negative for confusion.     PHYSICAL EXAMINATION: Vitals:   12/10/23 1458  BP: (!) 141/82  Pulse: 78  Resp: 18  Temp: 99.1 F (37.3 C)  SpO2: 98%   Filed Weights   12/10/23 1458  Weight: 297  lb 14.4 oz (135.1 kg)    Physical Exam Constitutional:      General: She is not in acute distress.    Appearance: She is obese.  HENT:     Head: Normocephalic and atraumatic.  Eyes:     General: No scleral icterus. Cardiovascular:     Rate and Rhythm: Normal rate and regular rhythm.     Heart sounds: Normal heart sounds.  Pulmonary:     Effort: Pulmonary effort is normal. No respiratory distress.     Breath sounds: No wheezing.  Abdominal:     General: Bowel sounds are normal. There is no distension.     Palpations: Abdomen is soft.  Musculoskeletal:        General: No deformity. Normal range of motion.     Cervical back: Normal range of motion and neck supple.  Skin:    General: Skin is warm and dry.     Findings: No erythema or rash.  Neurological:     Mental Status: She is alert and oriented to person, place, and time. Mental status is at baseline.  Psychiatric:        Mood and Affect: Mood normal.        RADIOGRAPHIC STUDIES: I have personally reviewed the radiological images as listed and agreed with the findings in the report. No results found.  LABORATORY DATA:  I have reviewed the data as listed    Latest Ref Rng & Units 12/10/2023    3:40 PM 10/04/2017    7:16 PM 12/03/2015    2:04 PM  CBC  WBC 4.0 - 10.5 K/uL 11.4  12.4  13.3   Hemoglobin 12.0 - 15.0 g/dL 8.5  52.8  9.5   Hematocrit 36.0 - 46.0 % 30.5  35.7  31.0  Platelets 150 - 400 K/uL 423  410  429       Latest Ref Rng & Units 12/10/2023    3:40 PM 10/04/2017    7:16 PM  CMP  Glucose 70 - 99 mg/dL 130  865   BUN 6 - 20 mg/dL 10  10   Creatinine 7.84 - 1.00 mg/dL 6.96  2.95   Sodium 284 - 145 mmol/L 136  140   Potassium 3.5 - 5.1 mmol/L 3.6  4.1   Chloride 98 - 111 mmol/L 103  102   CO2 22 - 32 mmol/L 24  26   Calcium 8.9 - 10.3 mg/dL 8.1  8.6   Total Protein 6.5 - 8.1 g/dL 7.5    Total Bilirubin 0.0 - 1.2 mg/dL 0.3    Alkaline Phos 38 - 126 U/L 78    AST 15 - 41 U/L 13    ALT 0 - 44 U/L  12

## 2023-12-13 LAB — COMP PANEL: LEUKEMIA/LYMPHOMA

## 2023-12-17 LAB — PROTEIN ELECTROPHORESIS, SERUM
A/G Ratio: 1 (ref 0.7–1.7)
Albumin ELP: 3.4 g/dL (ref 2.9–4.4)
Alpha-1-Globulin: 0.3 g/dL (ref 0.0–0.4)
Alpha-2-Globulin: 0.7 g/dL (ref 0.4–1.0)
Beta Globulin: 1.1 g/dL (ref 0.7–1.3)
Gamma Globulin: 1.4 g/dL (ref 0.4–1.8)
Globulin, Total: 3.5 g/dL (ref 2.2–3.9)
Total Protein ELP: 6.9 g/dL (ref 6.0–8.5)

## 2023-12-31 ENCOUNTER — Encounter: Payer: Self-pay | Admitting: Oncology

## 2023-12-31 ENCOUNTER — Inpatient Hospital Stay (HOSPITAL_BASED_OUTPATIENT_CLINIC_OR_DEPARTMENT_OTHER): Payer: 59 | Admitting: Oncology

## 2023-12-31 DIAGNOSIS — Z8639 Personal history of other endocrine, nutritional and metabolic disease: Secondary | ICD-10-CM

## 2023-12-31 DIAGNOSIS — D509 Iron deficiency anemia, unspecified: Secondary | ICD-10-CM | POA: Diagnosis not present

## 2023-12-31 DIAGNOSIS — D72829 Elevated white blood cell count, unspecified: Secondary | ICD-10-CM

## 2023-12-31 DIAGNOSIS — D508 Other iron deficiency anemias: Secondary | ICD-10-CM

## 2023-12-31 NOTE — Progress Notes (Signed)
 HEMATOLOGY-ONCOLOGY TeleHEALTH VISIT PROGRESS NOTE  I connected with Savannah Ramos on 12/31/23  at  2:45 PM EST by video enabled telemedicine visit and verified that I am speaking with the correct person using two identifiers. I discussed the limitations, risks, security and privacy concerns of performing an evaluation and management service by telemedicine and the availability of in-person appointments. The patient expressed understanding and agreed to proceed.   Other persons participating in the visit and their role in the encounter:  None  Patient's location: Home  Provider's location: office Chief Complaint: leukocytosis, iron deficiency anemia.    INTERVAL HISTORY Savannah Ramos is a 43 y.o. female who has above history reviewed by me today presents for follow up visit for management of leukocytosis, iron deficiency anemia.  Patient presents virtual to discuss results.  She has started oral iron supplementation about 1 week ago.  Denies hematochezia, hematuria, hematemesis, epistaxis, black tarry stool.   Review of Systems  Constitutional:  Positive for fatigue. Negative for appetite change, chills and fever.  HENT:   Negative for hearing loss and voice change.   Eyes:  Negative for eye problems.  Respiratory:  Negative for chest tightness and cough.   Cardiovascular:  Negative for chest pain.  Gastrointestinal:  Negative for abdominal distention, abdominal pain and blood in stool.  Endocrine: Negative for hot flashes.  Genitourinary:  Negative for difficulty urinating and frequency.   Musculoskeletal:  Negative for arthralgias.  Skin:  Negative for itching and rash.  Neurological:  Negative for extremity weakness.  Hematological:  Negative for adenopathy.  Psychiatric/Behavioral:  Negative for confusion.     Past Medical History:  Diagnosis Date   Allergic rhinitis    Anemia, iron deficiency    Asthma    spring/summer   Elevated blood pressure    no diagnosis of HTN    Obesity, Class III, BMI 40-49.9 (morbid obesity) (HCC)    BMI 66 (12/03/2015)   Pre-diabetes    Vitamin D deficiency    Past Surgical History:  Procedure Laterality Date   CESAREAN SECTION      Family History  Problem Relation Age of Onset   Diabetes Mother    Hypertension Mother    Diabetes Father    Hyperlipidemia Father    Hypertension Father    Heart failure Maternal Grandmother     Social History   Socioeconomic History   Marital status: Single    Spouse name: Not on file   Number of children: Not on file   Years of education: Not on file   Highest education level: Not on file  Occupational History    Employer: [employer]  Tobacco Use   Smoking status: Former   Smokeless tobacco: Never  Substance and Sexual Activity   Alcohol use: No    Alcohol/week: 0.0 standard drinks of alcohol   Drug use: No   Sexual activity: Yes    Partners: Male  Other Topics Concern   Not on file  Social History Narrative   Not on file   Social Drivers of Health   Financial Resource Strain: Low Risk  (12/10/2023)   Overall Financial Resource Strain (CARDIA)    Difficulty of Paying Living Expenses: Not very hard  Food Insecurity: No Food Insecurity (12/10/2023)   Hunger Vital Sign    Worried About Running Out of Food in the Last Year: Never true    Ran Out of Food in the Last Year: Never true  Transportation Needs: No Transportation  Needs (12/10/2023)   PRAPARE - Administrator, Civil Service (Medical): No    Lack of Transportation (Non-Medical): No  Physical Activity: Not on file  Stress: Not on file  Social Connections: Not on file  Intimate Partner Violence: Not At Risk (12/10/2023)   Humiliation, Afraid, Rape, and Kick questionnaire    Fear of Current or Ex-Partner: No    Emotionally Abused: No    Physically Abused: No    Sexually Abused: No    Current Outpatient Medications on File Prior to Visit  Medication Sig Dispense Refill   albuterol  (PROVENTIL HFA;VENTOLIN HFA) 108 (90 Base) MCG/ACT inhaler Inhale 2 puffs into the lungs every 6 (six) hours as needed for wheezing or shortness of breath. 1 Inhaler 2   albuterol (PROVENTIL) (2.5 MG/3ML) 0.083% nebulizer solution Take 2.5 mg by nebulization every 6 (six) hours as needed for wheezing or shortness of breath.     docusate sodium (COLACE) 100 MG capsule Take 100 mg by mouth 2 (two) times daily. (Patient not taking: Reported on 12/10/2023)     Iron-FA-B Cmp-C-Biot-Probiotic (FUSION PLUS) CAPS Take 1 capsule by mouth daily. 60 capsule 1   Prenat-FeFum-FePo-FA-DHA w/o A (PROVIDA DHA) 16-16-1.25-110 MG CAPS Take 1 tablet by mouth daily. (Patient not taking: Reported on 12/10/2023) 30 capsule 11   sertraline (ZOLOFT) 100 MG tablet Take 100 mg by mouth daily.     sitaGLIPtin (JANUVIA) 25 MG tablet Take 25 mg by mouth daily.     No current facility-administered medications on file prior to visit.    No Known Allergies     Observations/Objective: There were no vitals filed for this visit. There is no height or weight on file to calculate BMI.  Physical Exam Neurological:     Mental Status: She is alert.     CBC    Component Value Date/Time   WBC 11.4 (H) 12/10/2023 1540   RBC 4.29 12/10/2023 1540   HGB 8.5 (L) 12/10/2023 1540   HGB 9.5 (L) 12/03/2015 1404   HCT 30.5 (L) 12/10/2023 1540   HCT 31.0 (L) 12/03/2015 1404   PLT 423 (H) 12/10/2023 1540   PLT 429 (H) 12/03/2015 1404   MCV 71.1 (L) 12/10/2023 1540   MCV 67 (L) 12/03/2015 1404   MCH 19.8 (L) 12/10/2023 1540   MCHC 27.9 (L) 12/10/2023 1540   RDW 17.1 (H) 12/10/2023 1540   RDW 17.2 (H) 12/03/2015 1404   LYMPHSABS 2.0 12/10/2023 1540   LYMPHSABS 1.4 12/03/2015 1404   MONOABS 0.6 12/10/2023 1540   EOSABS 0.2 12/10/2023 1540   EOSABS 0.9 (H) 12/03/2015 1404   BASOSABS 0.0 12/10/2023 1540   BASOSABS 0.0 12/03/2015 1404    CMP     Component Value Date/Time   NA 136 12/10/2023 1540   K 3.6 12/10/2023 1540   CL 103  12/10/2023 1540   CO2 24 12/10/2023 1540   GLUCOSE 103 (H) 12/10/2023 1540   BUN 10 12/10/2023 1540   CREATININE 0.86 12/10/2023 1540   CALCIUM 8.1 (L) 12/10/2023 1540   PROT 7.5 12/10/2023 1540   ALBUMIN 3.5 12/10/2023 1540   AST 13 (L) 12/10/2023 1540   ALT 12 12/10/2023 1540   ALKPHOS 78 12/10/2023 1540   BILITOT 0.3 12/10/2023 1540   GFRNONAA >60 12/10/2023 1540   GFRAA >60 10/04/2017 1916    ASSESSMENT & PLAN:   Iron deficiency anemia Lab Results  Component Value Date   HGB 8.5 (L) 12/10/2023   TIBC 424 12/10/2023  IRONPCTSAT 5 (L) 12/10/2023   FERRITIN 10 (L) 12/10/2023    She has just started on iron supplementation.  I discussed about option of continue oral iron supplementation and repeat blood work for evaluation of treatment response.  If no significant improvement, then proceed with IV Venofer treatments. Alternative option of proceed with IV Venofer treatments. I discussed about the potential risks including but not limited to allergic reactions/infusion reactions including anaphylactic reactions, phlebitis, high blood pressure, wheezing, SOB, skin rash, weight gain,dark urine, leg swelling, back pain, headache, nausea and fatigue, etc. Patient prefers to try oral iron supplementation first, if not effective, she may consider IV Venofer Will repeat levels in a few month.   Leukocytosis  Labs reviewed and discussed with patient that Leukocytosis, predominantly neutrophilia, can be secondary to infection, chronic inflammation, smoking, autoimmune disease, or underlying bone marrow disorders.  normal LDH, negative peripheral flowcytometry, negative hepatitis, neg HIV, no M protein on monoclonal gammopathy workup.   Leukocytosis is likely reactive. I recommend observation.   Orders Placed This Encounter  Procedures   Basic Metabolic Panel - Cancer Center Only    Standing Status:   Future    Expected Date:   03/29/2024    Expiration Date:   12/30/2024   CBC with  Differential (Cancer Center Only)    Standing Status:   Future    Expected Date:   03/29/2024    Expiration Date:   12/30/2024   Ferritin    Standing Status:   Future    Expected Date:   03/29/2024    Expiration Date:   12/30/2024   Iron and TIBC    Standing Status:   Future    Expected Date:   03/29/2024    Expiration Date:   12/30/2024   Retic Panel    Standing Status:   Future    Expected Date:   03/29/2024    Expiration Date:   12/30/2024    I provided 25 minutes of face-to-face video visit time during this encounter, and > 50% was spent counseling as documented under my assessment & plan.  Rickard Patience, MD 12/31/2023 10:12 PM

## 2023-12-31 NOTE — Assessment & Plan Note (Addendum)
 Lab Results  Component Value Date   HGB 8.5 (L) 12/10/2023   TIBC 424 12/10/2023   IRONPCTSAT 5 (L) 12/10/2023   FERRITIN 10 (L) 12/10/2023    She has just started on iron supplementation.  I discussed about option of continue oral iron supplementation and repeat blood work for evaluation of treatment response.  If no significant improvement, then proceed with IV Venofer treatments. Alternative option of proceed with IV Venofer treatments. I discussed about the potential risks including but not limited to allergic reactions/infusion reactions including anaphylactic reactions, phlebitis, high blood pressure, wheezing, SOB, skin rash, weight gain,dark urine, leg swelling, back pain, headache, nausea and fatigue, etc. Patient prefers to try oral iron supplementation first, if not effective, she may consider IV Venofer Will repeat levels in a few month.

## 2023-12-31 NOTE — Assessment & Plan Note (Signed)
 Labs reviewed and discussed with patient that Leukocytosis, predominantly neutrophilia, can be secondary to infection, chronic inflammation, smoking, autoimmune disease, or underlying bone marrow disorders.  normal LDH, negative peripheral flowcytometry, negative hepatitis, neg HIV, no M protein on monoclonal gammopathy workup.   Leukocytosis is likely reactive. I recommend observation.

## 2024-04-07 ENCOUNTER — Inpatient Hospital Stay: Payer: 59

## 2024-04-07 DIAGNOSIS — D509 Iron deficiency anemia, unspecified: Secondary | ICD-10-CM | POA: Insufficient documentation

## 2024-04-07 DIAGNOSIS — D72829 Elevated white blood cell count, unspecified: Secondary | ICD-10-CM | POA: Insufficient documentation

## 2024-04-14 ENCOUNTER — Ambulatory Visit

## 2024-04-14 ENCOUNTER — Inpatient Hospital Stay: Payer: 59 | Admitting: Oncology

## 2024-04-14 ENCOUNTER — Inpatient Hospital Stay: Payer: 59

## 2024-04-25 ENCOUNTER — Encounter: Payer: Self-pay | Admitting: Oncology

## 2024-04-29 ENCOUNTER — Encounter: Payer: Self-pay | Admitting: Oncology

## 2024-04-29 ENCOUNTER — Inpatient Hospital Stay

## 2024-04-29 DIAGNOSIS — Z8639 Personal history of other endocrine, nutritional and metabolic disease: Secondary | ICD-10-CM

## 2024-04-29 DIAGNOSIS — D509 Iron deficiency anemia, unspecified: Secondary | ICD-10-CM | POA: Diagnosis not present

## 2024-04-29 DIAGNOSIS — D72829 Elevated white blood cell count, unspecified: Secondary | ICD-10-CM | POA: Diagnosis present

## 2024-04-29 LAB — CBC WITH DIFFERENTIAL (CANCER CENTER ONLY)
Abs Immature Granulocytes: 0.07 10*3/uL (ref 0.00–0.07)
Basophils Absolute: 0 10*3/uL (ref 0.0–0.1)
Basophils Relative: 0 %
Eosinophils Absolute: 0.2 10*3/uL (ref 0.0–0.5)
Eosinophils Relative: 2 %
HCT: 30.6 % — ABNORMAL LOW (ref 36.0–46.0)
Hemoglobin: 8.4 g/dL — ABNORMAL LOW (ref 12.0–15.0)
Immature Granulocytes: 1 %
Lymphocytes Relative: 17 %
Lymphs Abs: 1.7 10*3/uL (ref 0.7–4.0)
MCH: 19.3 pg — ABNORMAL LOW (ref 26.0–34.0)
MCHC: 27.5 g/dL — ABNORMAL LOW (ref 30.0–36.0)
MCV: 70.2 fL — ABNORMAL LOW (ref 80.0–100.0)
Monocytes Absolute: 0.5 10*3/uL (ref 0.1–1.0)
Monocytes Relative: 5 %
Neutro Abs: 7.5 10*3/uL (ref 1.7–7.7)
Neutrophils Relative %: 75 %
Platelet Count: 447 10*3/uL — ABNORMAL HIGH (ref 150–400)
RBC: 4.36 MIL/uL (ref 3.87–5.11)
RDW: 17.2 % — ABNORMAL HIGH (ref 11.5–15.5)
WBC Count: 9.9 10*3/uL (ref 4.0–10.5)
nRBC: 0 % (ref 0.0–0.2)

## 2024-04-29 LAB — BASIC METABOLIC PANEL - CANCER CENTER ONLY
Anion gap: 9 (ref 5–15)
BUN: 11 mg/dL (ref 6–20)
CO2: 25 mmol/L (ref 22–32)
Calcium: 8.5 mg/dL — ABNORMAL LOW (ref 8.9–10.3)
Chloride: 104 mmol/L (ref 98–111)
Creatinine: 0.55 mg/dL (ref 0.44–1.00)
GFR, Estimated: >60 mL/min (ref >60)
Glucose, Bld: 117 mg/dL — ABNORMAL HIGH (ref 70–99)
Potassium: 3.9 mmol/L (ref 3.5–5.1)
Sodium: 138 mmol/L (ref 135–145)

## 2024-04-29 LAB — RETIC PANEL
Immature Retic Fract: 20.9 % — ABNORMAL HIGH (ref 2.3–15.9)
RBC.: 4.38 MIL/uL (ref 3.87–5.11)
Retic Count, Absolute: 47.7 10*3/uL (ref 19.0–186.0)
Retic Ct Pct: 1.1 % (ref 0.4–3.1)
Reticulocyte Hemoglobin: 18.8 pg — ABNORMAL LOW (ref >27.9)

## 2024-04-29 LAB — FERRITIN: Ferritin: 5 ng/mL — ABNORMAL LOW (ref 11–307)

## 2024-04-29 LAB — IRON AND TIBC
Iron: 24 ug/dL — ABNORMAL LOW (ref 28–170)
Saturation Ratios: 6 % — ABNORMAL LOW (ref 10.4–31.8)
TIBC: 414 ug/dL (ref 250–450)
UIBC: 390 ug/dL

## 2024-05-06 ENCOUNTER — Encounter: Payer: Self-pay | Admitting: Oncology

## 2024-05-06 ENCOUNTER — Inpatient Hospital Stay

## 2024-05-06 ENCOUNTER — Inpatient Hospital Stay: Attending: Oncology | Admitting: Oncology

## 2024-05-06 VITALS — BP 123/97 | HR 75

## 2024-05-06 VITALS — BP 140/80 | HR 77 | Temp 98.3°F | Resp 18 | Ht 60.0 in | Wt 288.0 lb

## 2024-05-06 DIAGNOSIS — D508 Other iron deficiency anemias: Secondary | ICD-10-CM

## 2024-05-06 DIAGNOSIS — D72829 Elevated white blood cell count, unspecified: Secondary | ICD-10-CM | POA: Diagnosis not present

## 2024-05-06 DIAGNOSIS — D509 Iron deficiency anemia, unspecified: Secondary | ICD-10-CM | POA: Diagnosis present

## 2024-05-06 MED ORDER — SODIUM CHLORIDE 0.9% FLUSH
10.0000 mL | Freq: Once | INTRAVENOUS | Status: AC
  Administered 2024-05-06: 10 mL via INTRAVENOUS
  Filled 2024-05-06: qty 10

## 2024-05-06 MED ORDER — IRON SUCROSE 20 MG/ML IV SOLN
200.0000 mg | Freq: Once | INTRAVENOUS | Status: AC
  Administered 2024-05-06: 200 mg via INTRAVENOUS

## 2024-05-06 NOTE — Progress Notes (Signed)
Patient tolerated Venofer infusion well, no questions/concerns voiced. Monitored 30 min post transfusion. Patient stable at discharge. VSS. AVS given.

## 2024-05-06 NOTE — Assessment & Plan Note (Signed)
 Labs reviewed and discussed with patient that Leukocytosis, predominantly neutrophilia, can be secondary to infection, chronic inflammation, smoking, autoimmune disease, or underlying bone marrow disorders.  normal LDH, negative peripheral flowcytometry, negative hepatitis, neg HIV, no M protein on monoclonal gammopathy workup.   Leukocytosis is likely reactive. I recommend observation.

## 2024-05-06 NOTE — Patient Instructions (Signed)

## 2024-05-06 NOTE — Progress Notes (Signed)
 Hematology/Oncology Consult Note [phone] [phone]     REFERRING PROVIDER: Center, [facility]   CHIEF COMPLAINTS/REASON FOR VISIT:  Evaluation of leukocytosis  ASSESSMENT & PLAN:   Iron deficiency anemia Lab Results  Component Value Date   HGB 8.4 (L) 04/29/2024   TIBC 414 04/29/2024   IRONPCTSAT 6 (L) 04/29/2024   FERRITIN 5 (L) 04/29/2024   She agrees with IV venofer.  Recommend Venofer weekly x 4   Leukocytosis  Labs reviewed and discussed with patient that Leukocytosis, predominantly neutrophilia, can be secondary to infection, chronic inflammation, smoking, autoimmune disease, or underlying bone marrow disorders.  normal LDH, negative peripheral flowcytometry, negative hepatitis, neg HIV, no M protein on monoclonal gammopathy workup.   Leukocytosis is likely reactive. I recommend observation.    Orders Placed This Encounter  Procedures   CBC with Differential (Cancer Center Only)    Standing Status:   Future    Expected Date:   08/06/2024    Expiration Date:   11/04/2024   Iron and TIBC    Standing Status:   Future    Expected Date:   08/06/2024    Expiration Date:   11/04/2024   Ferritin    Standing Status:   Future    Expected Date:   08/06/2024    Expiration Date:   11/04/2024   Retic Panel    Standing Status:   Future    Expected Date:   08/06/2024    Expiration Date:   11/04/2024    follow up in 3 months [facility], [employer]* All questions were answered. The patient knows to call the clinic with any problems, questions or concerns.  Zelphia Cap, MD, PhD [facility] Hematology Oncology 05/06/2024    HISTORY OF PRESENTING ILLNESS:  Savannah Ramos is a  43 y.o.  female with PMH listed below who was referred to me for evaluation of leukocytosis Reviewed patient' recent labs obtained by PCP.   The patient has chronic elevated white blood cell count, dated back to 2017 Denies weight loss, fever, chills, fatigue,  night sweats.   She is currently on Nexplanon for birth control and has recently resumed taking iron tablets due to low iron levels. She experiences constipation with iron supplementation, which she manages with stool softeners and orange juice. She has been taking the iron tablets for about two weeks.  No foreign bodies such as dental implants or knee replacements. No chronic wounds, rashes, night sweats, or fever, although she experiences occasional hot flashes. She mentions having 'little bumps' but does not consider them significant. She reports a good appetite.  INTERVAL HISTORY Savannah Ramos is a 43 y.o. female who has above history reviewed by me today presents for follow up visit for iron defiency anemia.  + fatigue.  Patient has intentionally lost weight. She has depo implant  MEDICAL HISTORY:  Past Medical History:  Diagnosis Date   Allergic rhinitis    Anemia, iron deficiency    Asthma    spring/summer   Elevated blood pressure    no diagnosis of HTN   Obesity, Class III, BMI 40-49.9 (morbid obesity) (HCC)    BMI 66 (12/03/2015)   Pre-diabetes    Vitamin D deficiency     SURGICAL HISTORY: Past Surgical History:  Procedure Laterality Date   CESAREAN SECTION      SOCIAL HISTORY: Social History   Socioeconomic History   Marital status: Single    Spouse name: Not on file   Number  of children: Not on file   Years of education: Not on file   Highest education level: Not on file  Occupational History    Employer: [employer]  Tobacco Use   Smoking status: Former   Smokeless tobacco: Never  Substance and Sexual Activity   Alcohol use: No    Alcohol/week: 0.0 standard drinks of alcohol   Drug use: No   Sexual activity: Yes    Partners: Male  Other Topics Concern   Not on file  Social History Narrative   Not on file   Social Drivers of Health   Financial Resource Strain: Low Risk  (12/10/2023)   Overall Financial Resource Strain (CARDIA)     Difficulty of Paying Living Expenses: Not very hard  Food Insecurity: No Food Insecurity (12/10/2023)   Hunger Vital Sign    Worried About Running Out of Food in the Last Year: Never true    Ran Out of Food in the Last Year: Never true  Transportation Needs: No Transportation Needs (12/10/2023)   PRAPARE - Administrator, Civil Service (Medical): No    Lack of Transportation (Non-Medical): No  Physical Activity: Not on file  Stress: Not on file  Social Connections: Not on file  Intimate Partner Violence: Not At Risk (12/10/2023)   Humiliation, Afraid, Rape, and Kick questionnaire    Fear of Current or Ex-Partner: No    Emotionally Abused: No    Physically Abused: No    Sexually Abused: No    FAMILY HISTORY: Family History  Problem Relation Age of Onset   Diabetes Mother    Hypertension Mother    Diabetes Father    Hyperlipidemia Father    Hypertension Father    Heart failure Maternal Grandmother     ALLERGIES:  has no known allergies.  MEDICATIONS:  Current Outpatient Medications  Medication Sig Dispense Refill   albuterol  (PROVENTIL  HFA;VENTOLIN  HFA) 108 (90 Base) MCG/ACT inhaler Inhale 2 puffs into the lungs every 6 (six) hours as needed for wheezing or shortness of breath. 1 Inhaler 2   albuterol  (PROVENTIL ) (2.5 MG/3ML) 0.083% nebulizer solution Take 2.5 mg by nebulization every 6 (six) hours as needed for wheezing or shortness of breath.     Iron-FA-B Cmp-C-Biot-Probiotic (FUSION PLUS) CAPS Take 1 capsule by mouth daily. 60 capsule 1   sertraline (ZOLOFT) 100 MG tablet Take 100 mg by mouth daily.     sitaGLIPtin (JANUVIA) 25 MG tablet Take 25 mg by mouth daily.     docusate sodium (COLACE) 100 MG capsule Take 100 mg by mouth 2 (two) times daily. (Patient not taking: Reported on 05/06/2024)     Prenat-FeFum-FePo-FA-DHA w/o A (PROVIDA DHA) 16-16-1.25-110 MG CAPS Take 1 tablet by mouth daily. (Patient not taking: Reported on 05/06/2024) 30 capsule 11   No current  facility-administered medications for this visit.    Review of Systems  Constitutional:  Negative for appetite change, chills, fatigue and fever.  HENT:   Negative for hearing loss and voice change.   Eyes:  Negative for eye problems.  Respiratory:  Negative for chest tightness and cough.   Cardiovascular:  Negative for chest pain.  Gastrointestinal:  Positive for constipation. Negative for abdominal distention, abdominal pain and blood in stool.  Endocrine: Negative for hot flashes.  Genitourinary:  Negative for difficulty urinating and frequency.   Musculoskeletal:  Negative for arthralgias.  Skin:  Negative for itching and rash.  Neurological:  Negative for extremity weakness.  Hematological:  Negative for  adenopathy.  Psychiatric/Behavioral:  Negative for confusion.     PHYSICAL EXAMINATION: Vitals:   05/06/24 1337  BP: (!) 140/80  Pulse: 77  Resp: 18  Temp: 98.3 F (36.8 C)  SpO2: 100%   Filed Weights   05/06/24 1337  Weight: 288 lb (130.6 kg)    Physical Exam Constitutional:      General: She is not in acute distress.    Appearance: She is obese.  HENT:     Head: Normocephalic and atraumatic.   Eyes:     General: No scleral icterus.   Cardiovascular:     Rate and Rhythm: Normal rate and regular rhythm.     Heart sounds: Normal heart sounds.  Pulmonary:     Effort: Pulmonary effort is normal. No respiratory distress.     Breath sounds: No wheezing.  Abdominal:     General: Bowel sounds are normal. There is no distension.     Palpations: Abdomen is soft.   Musculoskeletal:        General: No deformity. Normal range of motion.     Cervical back: Normal range of motion and neck supple.   Skin:    General: Skin is warm and dry.     Findings: No erythema or rash.   Neurological:     Mental Status: She is alert and oriented to person, place, and time. Mental status is at baseline.   Psychiatric:        Mood and Affect: Mood normal.         RADIOGRAPHIC STUDIES: I have personally reviewed the radiological images as listed and agreed with the findings in the report. No results found.  LABORATORY DATA:  I have reviewed the data as listed    Latest Ref Rng & Units 04/29/2024    8:51 AM 12/10/2023    3:40 PM 10/04/2017    7:16 PM  CBC  WBC 4.0 - 10.5 K/uL 9.9  11.4  12.4   Hemoglobin 12.0 - 15.0 g/dL 8.4  8.5  88.9   Hematocrit 36.0 - 46.0 % 30.6  30.5  35.7   Platelets 150 - 400 K/uL 447  423  410       Latest Ref Rng & Units 04/29/2024    8:51 AM 12/10/2023    3:40 PM 10/04/2017    7:16 PM  CMP  Glucose 70 - 99 mg/dL 882  896  896   BUN 6 - 20 mg/dL 11  10  10    Creatinine 0.44 - 1.00 mg/dL 9.44  9.13  9.28   Sodium 135 - 145 mmol/L 138  136  140   Potassium 3.5 - 5.1 mmol/L 3.9  3.6  4.1   Chloride 98 - 111 mmol/L 104  103  102   CO2 22 - 32 mmol/L 25  24  26    Calcium 8.9 - 10.3 mg/dL 8.5  8.1  8.6   Total Protein 6.5 - 8.1 g/dL  7.5    Total Bilirubin 0.0 - 1.2 mg/dL  0.3    Alkaline Phos 38 - 126 U/L  78    AST 15 - 41 U/L  13    ALT 0 - 44 U/L  12

## 2024-05-06 NOTE — Assessment & Plan Note (Addendum)
 Lab Results  Component Value Date   HGB 8.4 (L) 04/29/2024   TIBC 414 04/29/2024   IRONPCTSAT 6 (L) 04/29/2024   FERRITIN 5 (L) 04/29/2024   She agrees with IV venofer.  Recommend Venofer weekly x 4

## 2024-05-06 NOTE — Progress Notes (Signed)
 Patient is doing well, this will be her first iron infusion, she is just a little nervous.

## 2024-05-14 ENCOUNTER — Inpatient Hospital Stay

## 2024-05-15 ENCOUNTER — Inpatient Hospital Stay

## 2024-05-15 VITALS — BP 122/73 | HR 79 | Temp 98.2°F | Resp 18

## 2024-05-15 DIAGNOSIS — D508 Other iron deficiency anemias: Secondary | ICD-10-CM

## 2024-05-15 DIAGNOSIS — D509 Iron deficiency anemia, unspecified: Secondary | ICD-10-CM | POA: Diagnosis not present

## 2024-05-15 MED ORDER — IRON SUCROSE 20 MG/ML IV SOLN
200.0000 mg | Freq: Once | INTRAVENOUS | Status: AC
  Administered 2024-05-15: 200 mg via INTRAVENOUS

## 2024-05-15 MED ORDER — SODIUM CHLORIDE 0.9% FLUSH
10.0000 mL | Freq: Once | INTRAVENOUS | Status: AC | PRN
  Administered 2024-05-15: 10 mL
  Filled 2024-05-15: qty 10

## 2024-05-21 ENCOUNTER — Inpatient Hospital Stay

## 2024-05-23 ENCOUNTER — Inpatient Hospital Stay

## 2024-05-23 VITALS — BP 112/79 | HR 78 | Temp 97.9°F

## 2024-05-23 DIAGNOSIS — D508 Other iron deficiency anemias: Secondary | ICD-10-CM

## 2024-05-23 DIAGNOSIS — D509 Iron deficiency anemia, unspecified: Secondary | ICD-10-CM | POA: Diagnosis not present

## 2024-05-23 MED ORDER — IRON SUCROSE 20 MG/ML IV SOLN
200.0000 mg | Freq: Once | INTRAVENOUS | Status: AC
  Administered 2024-05-23: 200 mg via INTRAVENOUS
  Filled 2024-05-23: qty 10

## 2024-05-23 MED ORDER — SODIUM CHLORIDE 0.9% FLUSH
10.0000 mL | Freq: Once | INTRAVENOUS | Status: AC | PRN
  Administered 2024-05-23: 10 mL
  Filled 2024-05-23: qty 10

## 2024-05-28 ENCOUNTER — Inpatient Hospital Stay

## 2024-05-30 ENCOUNTER — Inpatient Hospital Stay

## 2024-05-30 VITALS — BP 123/61 | HR 74 | Temp 97.8°F | Resp 18

## 2024-05-30 DIAGNOSIS — D509 Iron deficiency anemia, unspecified: Secondary | ICD-10-CM | POA: Diagnosis not present

## 2024-05-30 DIAGNOSIS — D508 Other iron deficiency anemias: Secondary | ICD-10-CM

## 2024-05-30 MED ORDER — IRON SUCROSE 20 MG/ML IV SOLN
200.0000 mg | Freq: Once | INTRAVENOUS | Status: AC
  Administered 2024-05-30: 200 mg via INTRAVENOUS
  Filled 2024-05-30: qty 10

## 2024-05-30 MED ORDER — SODIUM CHLORIDE 0.9% FLUSH
10.0000 mL | Freq: Once | INTRAVENOUS | Status: AC | PRN
Start: 2024-05-30 — End: 2024-05-30
  Administered 2024-05-30: 10 mL
  Filled 2024-05-30: qty 10

## 2024-08-05 ENCOUNTER — Inpatient Hospital Stay: Attending: Oncology

## 2024-08-05 DIAGNOSIS — D72829 Elevated white blood cell count, unspecified: Secondary | ICD-10-CM | POA: Diagnosis not present

## 2024-08-05 DIAGNOSIS — D509 Iron deficiency anemia, unspecified: Secondary | ICD-10-CM | POA: Insufficient documentation

## 2024-08-05 DIAGNOSIS — D508 Other iron deficiency anemias: Secondary | ICD-10-CM

## 2024-08-05 LAB — CBC WITH DIFFERENTIAL (CANCER CENTER ONLY)
Abs Immature Granulocytes: 0.08 K/uL — ABNORMAL HIGH (ref 0.00–0.07)
Basophils Absolute: 0 K/uL (ref 0.0–0.1)
Basophils Relative: 0 %
Eosinophils Absolute: 0.1 K/uL (ref 0.0–0.5)
Eosinophils Relative: 1 %
HCT: 37.5 % (ref 36.0–46.0)
Hemoglobin: 11.2 g/dL — ABNORMAL LOW (ref 12.0–15.0)
Immature Granulocytes: 1 %
Lymphocytes Relative: 16 %
Lymphs Abs: 1.6 K/uL (ref 0.7–4.0)
MCH: 23.5 pg — ABNORMAL LOW (ref 26.0–34.0)
MCHC: 29.9 g/dL — ABNORMAL LOW (ref 30.0–36.0)
MCV: 78.6 fL — ABNORMAL LOW (ref 80.0–100.0)
Monocytes Absolute: 0.6 K/uL (ref 0.1–1.0)
Monocytes Relative: 6 %
Neutro Abs: 7.5 K/uL (ref 1.7–7.7)
Neutrophils Relative %: 76 %
Platelet Count: 361 K/uL (ref 150–400)
RBC: 4.77 MIL/uL (ref 3.87–5.11)
RDW: 18.1 % — ABNORMAL HIGH (ref 11.5–15.5)
WBC Count: 9.9 K/uL (ref 4.0–10.5)
nRBC: 0 % (ref 0.0–0.2)

## 2024-08-05 LAB — IRON AND TIBC
Iron: 26 ug/dL — ABNORMAL LOW (ref 28–170)
Saturation Ratios: 8 % — ABNORMAL LOW (ref 10.4–31.8)
TIBC: 326 ug/dL (ref 250–450)
UIBC: 300 ug/dL

## 2024-08-05 LAB — RETIC PANEL
Immature Retic Fract: 12.8 % (ref 2.3–15.9)
RBC.: 4.76 MIL/uL (ref 3.87–5.11)
Retic Count, Absolute: 45.7 K/uL (ref 19.0–186.0)
Retic Ct Pct: 1 % (ref 0.4–3.1)
Reticulocyte Hemoglobin: 26.6 pg — ABNORMAL LOW (ref >27.9)

## 2024-08-05 LAB — FERRITIN: Ferritin: 33 ng/mL (ref 11–307)

## 2024-08-07 ENCOUNTER — Ambulatory Visit

## 2024-08-07 ENCOUNTER — Ambulatory Visit: Admitting: Oncology

## 2024-08-27 ENCOUNTER — Inpatient Hospital Stay

## 2024-08-27 ENCOUNTER — Encounter: Payer: Self-pay | Admitting: Oncology

## 2024-08-27 ENCOUNTER — Inpatient Hospital Stay: Attending: Oncology | Admitting: Oncology

## 2024-08-27 VITALS — BP 126/67 | HR 88 | Temp 98.4°F | Resp 16 | Wt 290.0 lb

## 2024-08-27 VITALS — BP 138/92 | HR 76

## 2024-08-27 DIAGNOSIS — D509 Iron deficiency anemia, unspecified: Secondary | ICD-10-CM | POA: Diagnosis present

## 2024-08-27 DIAGNOSIS — D508 Other iron deficiency anemias: Secondary | ICD-10-CM

## 2024-08-27 MED ORDER — IRON SUCROSE 20 MG/ML IV SOLN
200.0000 mg | Freq: Once | INTRAVENOUS | Status: AC
  Administered 2024-08-27: 200 mg via INTRAVENOUS

## 2024-08-27 NOTE — Patient Instructions (Signed)

## 2024-08-27 NOTE — Progress Notes (Signed)
 Hematology/Oncology Consult Note [phone] [phone]     REFERRING PROVIDER: Center, [facility]   CHIEF COMPLAINTS/REASON FOR VISIT:  Iron  deficiency anemia  ASSESSMENT & PLAN:   Iron  deficiency anemia Lab Results  Component Value Date   HGB 11.2 (L) 08/05/2024   TIBC 326 08/05/2024   IRONPCTSAT 8 (L) 08/05/2024   FERRITIN 33 08/05/2024   Recommend Venofer  weekly x 2 to further improve iron  store   Orders Placed This Encounter  Procedures   CBC with Differential (Cancer Center Only)    Standing Status:   Future    Expected Date:   02/25/2025    Expiration Date:   05/26/2025   Ferritin    Standing Status:   Future    Expected Date:   02/25/2025    Expiration Date:   05/26/2025   Iron  and TIBC    Standing Status:   Future    Expected Date:   02/25/2025    Expiration Date:   05/26/2025   Hgb Fractionation Cascade    Standing Status:   Future    Expected Date:   02/25/2025    Expiration Date:   05/26/2025    follow up in 3 months [facility], [employer]* All questions were answered. The patient knows to call the clinic with any problems, questions or concerns.  Zelphia Cap, MD, PhD [facility] Hematology Oncology 08/27/2024    HISTORY OF PRESENTING ILLNESS:  Savannah Ramos is a  43 y.o.  female with PMH listed below who was referred to me for evaluation of leukocytosis Reviewed patient' recent labs obtained by PCP.   The patient has chronic elevated white blood cell count, dated back to 2017 Denies weight loss, fever, chills, fatigue, night sweats.   She is currently on Nexplanon for birth control and has recently resumed taking iron  tablets due to low iron  levels. She experiences constipation with iron  supplementation, which she manages with stool softeners and orange juice. She has been taking the iron  tablets for about two weeks.  No foreign bodies such as dental implants or knee replacements. No chronic wounds, rashes, night  sweats, or fever, although she experiences occasional hot flashes. She mentions having 'little bumps' but does not consider them significant. She reports a good appetite.  INTERVAL HISTORY Savannah Ramos is a 43 y.o. female who has above history reviewed by me today presents for follow up visit for iron  defiency anemia.  + fatigue.  She has depo implant  MEDICAL HISTORY:  Past Medical History:  Diagnosis Date   Allergic rhinitis    Anemia, iron  deficiency    Asthma    spring/summer   Elevated blood pressure    no diagnosis of HTN   Obesity, Class III, BMI 40-49.9 (morbid obesity) (HCC)    BMI 66 (12/03/2015)   Pre-diabetes    Vitamin D deficiency     SURGICAL HISTORY: Past Surgical History:  Procedure Laterality Date   CESAREAN SECTION      SOCIAL HISTORY: Social History   Socioeconomic History   Marital status: Single    Spouse name: Not on file   Number of children: Not on file   Years of education: Not on file   Highest education level: Not on file  Occupational History    Employer: [employer]  Tobacco Use   Smoking status: Former   Smokeless tobacco: Never  Substance and Sexual Activity   Alcohol use: No    Alcohol/week: 0.0 standard drinks of  alcohol   Drug use: No   Sexual activity: Yes    Partners: Male  Other Topics Concern   Not on file  Social History Narrative   Not on file   Social Drivers of Health   Financial Resource Strain: Low Risk  (12/10/2023)   Overall Financial Resource Strain (CARDIA)    Difficulty of Paying Living Expenses: Not very hard  Food Insecurity: No Food Insecurity (12/10/2023)   Hunger Vital Sign    Worried About Running Out of Food in the Last Year: Never true    Ran Out of Food in the Last Year: Never true  Transportation Needs: No Transportation Needs (12/10/2023)   PRAPARE - Administrator, Civil Service (Medical): No    Lack of Transportation (Non-Medical): No  Physical Activity: Not on file   Stress: Not on file  Social Connections: Not on file  Intimate Partner Violence: Not At Risk (12/10/2023)   Humiliation, Afraid, Rape, and Kick questionnaire    Fear of Current or Ex-Partner: No    Emotionally Abused: No    Physically Abused: No    Sexually Abused: No    FAMILY HISTORY: Family History  Problem Relation Age of Onset   Diabetes Mother    Hypertension Mother    Diabetes Father    Hyperlipidemia Father    Hypertension Father    Heart failure Maternal Grandmother     ALLERGIES:  has no known allergies.  MEDICATIONS:  Current Outpatient Medications  Medication Sig Dispense Refill   albuterol  (PROVENTIL  HFA;VENTOLIN  HFA) 108 (90 Base) MCG/ACT inhaler Inhale 2 puffs into the lungs every 6 (six) hours as needed for wheezing or shortness of breath. 1 Inhaler 2   albuterol  (PROVENTIL ) (2.5 MG/3ML) 0.083% nebulizer solution Take 2.5 mg by nebulization every 6 (six) hours as needed for wheezing or shortness of breath.     Iron -FA-B Cmp-C-Biot-Probiotic (FUSION PLUS) CAPS Take 1 capsule by mouth daily. 60 capsule 1   sertraline (ZOLOFT) 100 MG tablet Take 100 mg by mouth daily.     docusate sodium (COLACE) 100 MG capsule Take 100 mg by mouth 2 (two) times daily. (Patient not taking: Reported on 08/27/2024)     Prenat-FeFum-FePo-FA-DHA w/o A (PROVIDA DHA) 16-16-1.25-110 MG CAPS Take 1 tablet by mouth daily. (Patient not taking: Reported on 08/27/2024) 30 capsule 11   sitaGLIPtin (JANUVIA) 25 MG tablet Take 25 mg by mouth daily. (Patient not taking: Reported on 08/27/2024)     No current facility-administered medications for this visit.    Review of Systems  Constitutional:  Negative for appetite change, chills, fatigue and fever.  HENT:   Negative for hearing loss and voice change.   Eyes:  Negative for eye problems.  Respiratory:  Negative for chest tightness and cough.   Cardiovascular:  Negative for chest pain.  Gastrointestinal:  Positive for constipation. Negative  for abdominal distention, abdominal pain and blood in stool.  Endocrine: Negative for hot flashes.  Genitourinary:  Negative for difficulty urinating and frequency.   Musculoskeletal:  Negative for arthralgias.  Skin:  Negative for itching and rash.  Neurological:  Negative for extremity weakness.  Hematological:  Negative for adenopathy.  Psychiatric/Behavioral:  Negative for confusion.     PHYSICAL EXAMINATION: Vitals:   08/27/24 1010  BP: 126/67  Pulse: 88  Resp: 16  Temp: 98.4 F (36.9 C)  SpO2: 96%   Filed Weights   08/27/24 1010  Weight: 290 lb (131.5 kg)    Physical Exam Constitutional:  General: She is not in acute distress.    Appearance: She is obese.  HENT:     Head: Normocephalic and atraumatic.  Eyes:     General: No scleral icterus. Cardiovascular:     Rate and Rhythm: Normal rate and regular rhythm.     Heart sounds: Normal heart sounds.  Pulmonary:     Effort: Pulmonary effort is normal. No respiratory distress.     Breath sounds: No wheezing.  Abdominal:     General: Bowel sounds are normal. There is no distension.     Palpations: Abdomen is soft.  Musculoskeletal:        General: No deformity. Normal range of motion.     Cervical back: Normal range of motion and neck supple.  Skin:    General: Skin is warm and dry.     Findings: No erythema or rash.  Neurological:     Mental Status: She is alert and oriented to person, place, and time. Mental status is at baseline.  Psychiatric:        Mood and Affect: Mood normal.        RADIOGRAPHIC STUDIES: I have personally reviewed the radiological images as listed and agreed with the findings in the report. No results found.  LABORATORY DATA:  I have reviewed the data as listed    Latest Ref Rng & Units 08/05/2024    8:26 AM 04/29/2024    8:51 AM 12/10/2023    3:40 PM  CBC  WBC 4.0 - 10.5 K/uL 9.9  9.9  11.4   Hemoglobin 12.0 - 15.0 g/dL 88.7  8.4  8.5   Hematocrit 36.0 - 46.0 % 37.5   30.6  30.5   Platelets 150 - 400 K/uL 361  447  423       Latest Ref Rng & Units 04/29/2024    8:51 AM 12/10/2023    3:40 PM 10/04/2017    7:16 PM  CMP  Glucose 70 - 99 mg/dL 882  896  896   BUN 6 - 20 mg/dL 11  10  10    Creatinine 0.44 - 1.00 mg/dL 9.44  9.13  9.28   Sodium 135 - 145 mmol/L 138  136  140   Potassium 3.5 - 5.1 mmol/L 3.9  3.6  4.1   Chloride 98 - 111 mmol/L 104  103  102   CO2 22 - 32 mmol/L 25  24  26    Calcium 8.9 - 10.3 mg/dL 8.5  8.1  8.6   Total Protein 6.5 - 8.1 g/dL  7.5    Total Bilirubin 0.0 - 1.2 mg/dL  0.3    Alkaline Phos 38 - 126 U/L  78    AST 15 - 41 U/L  13    ALT 0 - 44 U/L  12

## 2024-08-27 NOTE — Assessment & Plan Note (Addendum)
 Lab Results  Component Value Date   HGB 11.2 (L) 08/05/2024   TIBC 326 08/05/2024   IRONPCTSAT 8 (L) 08/05/2024   FERRITIN 33 08/05/2024   Recommend Venofer  weekly x 2 to further improve iron  store

## 2024-09-17 ENCOUNTER — Inpatient Hospital Stay: Attending: Oncology

## 2024-09-17 VITALS — BP 124/71 | HR 77 | Temp 99.4°F | Resp 16

## 2024-09-17 DIAGNOSIS — D509 Iron deficiency anemia, unspecified: Secondary | ICD-10-CM | POA: Diagnosis present

## 2024-09-17 DIAGNOSIS — D508 Other iron deficiency anemias: Secondary | ICD-10-CM

## 2024-09-17 MED ORDER — IRON SUCROSE 20 MG/ML IV SOLN
200.0000 mg | Freq: Once | INTRAVENOUS | Status: AC
  Administered 2024-09-17: 200 mg via INTRAVENOUS
  Filled 2024-09-17: qty 10

## 2024-09-17 NOTE — Patient Instructions (Signed)

## 2024-10-17 ENCOUNTER — Other Ambulatory Visit: Payer: Self-pay | Admitting: Family Medicine

## 2024-10-17 DIAGNOSIS — Z1231 Encounter for screening mammogram for malignant neoplasm of breast: Secondary | ICD-10-CM

## 2025-02-18 ENCOUNTER — Inpatient Hospital Stay

## 2025-02-25 ENCOUNTER — Inpatient Hospital Stay

## 2025-02-25 ENCOUNTER — Inpatient Hospital Stay: Admitting: Oncology
